# Patient Record
Sex: Female | Born: 1935 | Race: Black or African American | Hispanic: No | Marital: Married
Health system: Southern US, Community
[De-identification: ages and names within clinical notes are randomized; demographics above are authoritative.]

## PROBLEM LIST (undated history)

## (undated) DIAGNOSIS — I10 Essential (primary) hypertension: Secondary | ICD-10-CM

## (undated) DIAGNOSIS — E119 Type 2 diabetes mellitus without complications: Secondary | ICD-10-CM

## (undated) DIAGNOSIS — A419 Sepsis, unspecified organism: Secondary | ICD-10-CM

## (undated) DIAGNOSIS — J9620 Acute and chronic respiratory failure, unspecified whether with hypoxia or hypercapnia: Secondary | ICD-10-CM

## (undated) DIAGNOSIS — I4891 Unspecified atrial fibrillation: Secondary | ICD-10-CM

## (undated) HISTORY — PX: CHOLECYSTECTOMY: SHX55

## (undated) HISTORY — PX: TRACHEOSTOMY: SUR1362

## (undated) HISTORY — PX: PEG TUBE PLACEMENT: SUR1034

---

## 2014-01-28 DIAGNOSIS — A419 Sepsis, unspecified organism: Secondary | ICD-10-CM

## 2014-01-28 HISTORY — DX: Sepsis, unspecified organism: A41.9

## 2014-04-17 ENCOUNTER — Emergency Department (HOSPITAL_COMMUNITY): Payer: Medicare Other

## 2014-04-17 ENCOUNTER — Encounter (HOSPITAL_COMMUNITY): Payer: Self-pay | Admitting: Emergency Medicine

## 2014-04-17 ENCOUNTER — Inpatient Hospital Stay (HOSPITAL_COMMUNITY)
Admission: EM | Admit: 2014-04-17 | Discharge: 2014-04-22 | DRG: 871 | Disposition: A | Discharge status: Other Acute Inpatient Hospital | Payer: Medicare Other | Attending: Internal Medicine | Admitting: Internal Medicine

## 2014-04-17 ENCOUNTER — Inpatient Hospital Stay (HOSPITAL_COMMUNITY): Payer: Medicare Other

## 2014-04-17 DIAGNOSIS — N179 Acute kidney failure, unspecified: Secondary | ICD-10-CM

## 2014-04-17 DIAGNOSIS — J9610 Chronic respiratory failure, unspecified whether with hypoxia or hypercapnia: Secondary | ICD-10-CM | POA: Diagnosis present

## 2014-04-17 DIAGNOSIS — R6520 Severe sepsis without septic shock: Secondary | ICD-10-CM | POA: Diagnosis not present

## 2014-04-17 DIAGNOSIS — Z515 Encounter for palliative care: Secondary | ICD-10-CM | POA: Diagnosis not present

## 2014-04-17 DIAGNOSIS — E875 Hyperkalemia: Secondary | ICD-10-CM

## 2014-04-17 DIAGNOSIS — G9341 Metabolic encephalopathy: Secondary | ICD-10-CM | POA: Diagnosis present

## 2014-04-17 DIAGNOSIS — R6521 Severe sepsis with septic shock: Secondary | ICD-10-CM | POA: Diagnosis not present

## 2014-04-17 DIAGNOSIS — R911 Solitary pulmonary nodule: Secondary | ICD-10-CM | POA: Diagnosis present

## 2014-04-17 DIAGNOSIS — B9620 Unspecified Escherichia coli [E. coli] as the cause of diseases classified elsewhere: Secondary | ICD-10-CM | POA: Diagnosis not present

## 2014-04-17 DIAGNOSIS — I4581 Long QT syndrome: Secondary | ICD-10-CM | POA: Diagnosis not present

## 2014-04-17 DIAGNOSIS — D638 Anemia in other chronic diseases classified elsewhere: Secondary | ICD-10-CM | POA: Diagnosis present

## 2014-04-17 DIAGNOSIS — D696 Thrombocytopenia, unspecified: Secondary | ICD-10-CM | POA: Diagnosis not present

## 2014-04-17 DIAGNOSIS — J9621 Acute and chronic respiratory failure with hypoxia: Secondary | ICD-10-CM | POA: Diagnosis present

## 2014-04-17 DIAGNOSIS — J189 Pneumonia, unspecified organism: Secondary | ICD-10-CM | POA: Diagnosis present

## 2014-04-17 DIAGNOSIS — A419 Sepsis, unspecified organism: Secondary | ICD-10-CM | POA: Diagnosis present

## 2014-04-17 DIAGNOSIS — N390 Urinary tract infection, site not specified: Secondary | ICD-10-CM | POA: Diagnosis present

## 2014-04-17 DIAGNOSIS — E860 Dehydration: Secondary | ICD-10-CM

## 2014-04-17 DIAGNOSIS — L89159 Pressure ulcer of sacral region, unspecified stage: Secondary | ICD-10-CM | POA: Diagnosis not present

## 2014-04-17 DIAGNOSIS — E870 Hyperosmolality and hypernatremia: Secondary | ICD-10-CM | POA: Diagnosis present

## 2014-04-17 DIAGNOSIS — E871 Hypo-osmolality and hyponatremia: Secondary | ICD-10-CM | POA: Diagnosis present

## 2014-04-17 DIAGNOSIS — I129 Hypertensive chronic kidney disease with stage 1 through stage 4 chronic kidney disease, or unspecified chronic kidney disease: Secondary | ICD-10-CM | POA: Diagnosis not present

## 2014-04-17 DIAGNOSIS — Z794 Long term (current) use of insulin: Secondary | ICD-10-CM

## 2014-04-17 DIAGNOSIS — Y95 Nosocomial condition: Secondary | ICD-10-CM | POA: Diagnosis not present

## 2014-04-17 DIAGNOSIS — Z9911 Dependence on respirator [ventilator] status: Secondary | ICD-10-CM | POA: Diagnosis not present

## 2014-04-17 DIAGNOSIS — N281 Cyst of kidney, acquired: Secondary | ICD-10-CM | POA: Diagnosis present

## 2014-04-17 DIAGNOSIS — I480 Paroxysmal atrial fibrillation: Secondary | ICD-10-CM | POA: Diagnosis not present

## 2014-04-17 DIAGNOSIS — G9340 Encephalopathy, unspecified: Secondary | ICD-10-CM

## 2014-04-17 DIAGNOSIS — N189 Chronic kidney disease, unspecified: Secondary | ICD-10-CM | POA: Diagnosis present

## 2014-04-17 DIAGNOSIS — Z931 Gastrostomy status: Secondary | ICD-10-CM | POA: Diagnosis not present

## 2014-04-17 DIAGNOSIS — Z930 Tracheostomy status: Secondary | ICD-10-CM | POA: Diagnosis not present

## 2014-04-17 DIAGNOSIS — D649 Anemia, unspecified: Secondary | ICD-10-CM | POA: Diagnosis not present

## 2014-04-17 DIAGNOSIS — R739 Hyperglycemia, unspecified: Secondary | ICD-10-CM

## 2014-04-17 DIAGNOSIS — E1165 Type 2 diabetes mellitus with hyperglycemia: Secondary | ICD-10-CM | POA: Diagnosis not present

## 2014-04-17 HISTORY — DX: Essential (primary) hypertension: I10

## 2014-04-17 HISTORY — DX: Unspecified atrial fibrillation: I48.91

## 2014-04-17 HISTORY — DX: Acute and chronic respiratory failure, unspecified whether with hypoxia or hypercapnia: J96.20

## 2014-04-17 HISTORY — DX: Type 2 diabetes mellitus without complications: E11.9

## 2014-04-17 HISTORY — DX: Sepsis, unspecified organism: A41.9

## 2014-04-17 LAB — PROTIME-INR
INR: 1.51 — ABNORMAL HIGH (ref 0.00–1.49)
Prothrombin Time: 18.4 seconds — ABNORMAL HIGH (ref 11.6–15.2)

## 2014-04-17 LAB — I-STAT ARTERIAL BLOOD GAS, ED
Acid-base deficit: 1 mmol/L (ref 0.0–2.0)
Acid-base deficit: 4 mmol/L — ABNORMAL HIGH (ref 0.0–2.0)
Bicarbonate: 25.1 mEq/L — ABNORMAL HIGH (ref 20.0–24.0)
Bicarbonate: 21.9 mEq/L (ref 20.0–24.0)
O2 SAT: 78 %
O2 Saturation: 94 %
PCO2 ART: 45.7 mmHg — AB (ref 35.0–45.0)
PH ART: 7.344 — AB (ref 7.350–7.450)
PO2 ART: 70 mmHg — AB (ref 80.0–100.0)
Patient temperature: 97.3
Patient temperature: 94.8
TCO2: 27 mmol/L (ref 0–100)
TCO2: 23 mmol/L (ref 0–100)
pCO2 arterial: 41.3 mmHg (ref 35.0–45.0)
pH, Arterial: 7.322 — ABNORMAL LOW (ref 7.350–7.450)
pO2, Arterial: 44 mmHg — ABNORMAL LOW (ref 80.0–100.0)

## 2014-04-17 LAB — CBC
HEMATOCRIT: 32.4 % — AB (ref 36.0–46.0)
Hemoglobin: 8.4 g/dL — ABNORMAL LOW (ref 12.0–15.0)
MCH: 27.5 pg (ref 26.0–34.0)
MCHC: 25.9 g/dL — AB (ref 30.0–36.0)
MCV: 106.2 fL — AB (ref 78.0–100.0)
Platelets: 44 10*3/uL — ABNORMAL LOW (ref 150–400)
RBC: 3.05 MIL/uL — ABNORMAL LOW (ref 3.87–5.11)
RDW: 19.6 % — AB (ref 11.5–15.5)
WBC: 11.7 10*3/uL — ABNORMAL HIGH (ref 4.0–10.5)

## 2014-04-17 LAB — CBC WITH DIFFERENTIAL/PLATELET
Basophils Absolute: 0.1 10*3/uL (ref 0.0–0.1)
Basophils Relative: 1 % (ref 0–1)
EOS ABS: 0.1 10*3/uL (ref 0.0–0.7)
Eosinophils Relative: 1 % (ref 0–5)
HCT: 34 % — ABNORMAL LOW (ref 36.0–46.0)
HEMOGLOBIN: 8.9 g/dL — AB (ref 12.0–15.0)
LYMPHS ABS: 3.9 10*3/uL (ref 0.7–4.0)
LYMPHS PCT: 28 % (ref 12–46)
MCH: 28 pg (ref 26.0–34.0)
MCHC: 26.2 g/dL — AB (ref 30.0–36.0)
MCV: 106.9 fL — ABNORMAL HIGH (ref 78.0–100.0)
Monocytes Absolute: 0.7 10*3/uL (ref 0.1–1.0)
Monocytes Relative: 5 % (ref 3–12)
NEUTROS ABS: 9 10*3/uL — AB (ref 1.7–7.7)
Neutrophils Relative %: 65 % (ref 43–77)
Platelets: 49 10*3/uL — ABNORMAL LOW (ref 150–400)
RBC: 3.18 MIL/uL — AB (ref 3.87–5.11)
RDW: 19.5 % — AB (ref 11.5–15.5)
WBC: 13.8 10*3/uL — ABNORMAL HIGH (ref 4.0–10.5)

## 2014-04-17 LAB — I-STAT CG4 LACTIC ACID, ED
Lactic Acid, Venous: 3.14 mmol/L (ref 0.5–2.0)
Lactic Acid, Venous: 3.33 mmol/L (ref 0.5–2.0)

## 2014-04-17 LAB — CBG MONITORING, ED
GLUCOSE-CAPILLARY: 547 mg/dL — AB (ref 70–99)
Glucose-Capillary: 547 mg/dL — ABNORMAL HIGH (ref 70–99)
Glucose-Capillary: 575 mg/dL (ref 70–99)
Glucose-Capillary: 520 mg/dL — ABNORMAL HIGH (ref 70–99)
Glucose-Capillary: 470 mg/dL — ABNORMAL HIGH (ref 70–99)

## 2014-04-17 LAB — URINALYSIS, ROUTINE W REFLEX MICROSCOPIC
BILIRUBIN URINE: NEGATIVE
GLUCOSE, UA: 100 mg/dL — AB
Ketones, ur: NEGATIVE mg/dL
Nitrite: NEGATIVE
Protein, ur: 30 mg/dL — AB
Specific Gravity, Urine: 1.019 (ref 1.005–1.030)
Urobilinogen, UA: 1 mg/dL (ref 0.0–1.0)
pH: 5 (ref 5.0–8.0)

## 2014-04-17 LAB — COMPREHENSIVE METABOLIC PANEL
ALBUMIN: 2 g/dL — AB (ref 3.5–5.2)
ALT: 16 U/L (ref 0–35)
ALT: 17 U/L (ref 0–35)
AST: 29 U/L (ref 0–37)
AST: 27 U/L (ref 0–37)
Albumin: 1.9 g/dL — ABNORMAL LOW (ref 3.5–5.2)
Alkaline Phosphatase: 94 U/L (ref 39–117)
Alkaline Phosphatase: 78 U/L (ref 39–117)
BUN: 255 mg/dL — ABNORMAL HIGH (ref 6–23)
BUN: 245 mg/dL — ABNORMAL HIGH (ref 6–23)
CO2: 22 mmol/L (ref 19–32)
CO2: 23 mmol/L (ref 19–32)
Calcium: 9.9 mg/dL (ref 8.4–10.5)
Calcium: 9.3 mg/dL (ref 8.4–10.5)
Chloride: >130 mmol/L (ref 96–112)
Creatinine, Ser: 3.22 mg/dL — ABNORMAL HIGH (ref 0.50–1.10)
Creatinine, Ser: 3.03 mg/dL — ABNORMAL HIGH (ref 0.50–1.10)
GFR calc Af Amer: 15 mL/min — ABNORMAL LOW (ref >90)
GFR calc non Af Amer: 13 mL/min — ABNORMAL LOW (ref >90)
GFR calc non Af Amer: 14 mL/min — ABNORMAL LOW (ref >90)
GFR, EST AFRICAN AMERICAN: 16 mL/min — AB (ref >90)
GLUCOSE: 656 mg/dL — AB (ref 70–99)
Glucose, Bld: 712 mg/dL (ref 70–99)
POTASSIUM: 5.9 mmol/L — AB (ref 3.5–5.1)
POTASSIUM: 4.5 mmol/L (ref 3.5–5.1)
SODIUM: 174 mmol/L — AB (ref 135–145)
Sodium: 174 mmol/L (ref 135–145)
TOTAL PROTEIN: 7.8 g/dL (ref 6.0–8.3)
Total Bilirubin: 0.6 mg/dL (ref 0.3–1.2)
Total Bilirubin: 0.3 mg/dL (ref 0.3–1.2)
Total Protein: 7.1 g/dL (ref 6.0–8.3)

## 2014-04-17 LAB — PROCALCITONIN: Procalcitonin: 5.93 ng/mL

## 2014-04-17 LAB — SAVE SMEAR

## 2014-04-17 LAB — RETICULOCYTES
RBC.: 2.03 MIL/uL — ABNORMAL LOW (ref 3.87–5.11)
RETIC CT PCT: 3.8 % — AB (ref 0.4–3.1)
Retic Count, Absolute: 77.1 10*3/uL (ref 19.0–186.0)

## 2014-04-17 LAB — URINE MICROSCOPIC-ADD ON

## 2014-04-17 LAB — AMMONIA: Ammonia: 20 umol/L (ref 11–32)

## 2014-04-17 LAB — APTT: aPTT: 38 seconds — ABNORMAL HIGH (ref 24–37)

## 2014-04-17 LAB — LACTIC ACID, PLASMA: Lactic Acid, Venous: 3.6 mmol/L (ref 0.5–2.0)

## 2014-04-17 MED ORDER — SODIUM CHLORIDE 0.9 % IV BOLUS (SEPSIS)
1000.0000 mL | Freq: Once | INTRAVENOUS | Status: AC
  Administered 2014-04-17: 1000 mL via INTRAVENOUS

## 2014-04-17 MED ORDER — SODIUM CHLORIDE 0.9 % IV BOLUS (SEPSIS)
1000.0000 mL | Freq: Once | INTRAVENOUS | Status: AC
Start: 2014-04-18 — End: 2014-04-18
  Administered 2014-04-17: 1000 mL via INTRAVENOUS

## 2014-04-17 MED ORDER — SODIUM CHLORIDE 0.9 % IV BOLUS (SEPSIS): 1000.0000 mL | INTRAVENOUS | Status: DC

## 2014-04-17 MED ORDER — DEXTROSE 50 % IV SOLN
25.0000 mL | INTRAVENOUS | Status: DC | PRN
  Administered 2014-04-18 – 2014-04-19 (×2): 25 mL via INTRAVENOUS
  Filled 2014-04-17 (×2): qty 50

## 2014-04-17 MED ORDER — SODIUM CHLORIDE 0.45 % IV SOLN
INTRAVENOUS | Status: DC
  Administered 2014-04-17: 20:00:00 via INTRAVENOUS

## 2014-04-17 MED ORDER — DEXTROSE-NACL 5-0.45 % IV SOLN: INTRAVENOUS | Status: DC

## 2014-04-17 MED ORDER — DEXTROSE 5 % IV SOLN
1.0000 g | Freq: Once | INTRAVENOUS | Status: AC
  Administered 2014-04-17: 1 g via INTRAVENOUS
  Filled 2014-04-17: qty 10

## 2014-04-17 MED ORDER — SODIUM CHLORIDE 0.9 % IV SOLN: INTRAVENOUS | Status: DC

## 2014-04-17 MED ORDER — SODIUM CHLORIDE 0.9 % IV SOLN
INTRAVENOUS | Status: DC
  Administered 2014-04-17 – 2014-04-18 (×2): via INTRAVENOUS

## 2014-04-17 MED ORDER — PIPERACILLIN-TAZOBACTAM 3.375 G IVPB
3.3750 g | Freq: Three times a day (TID) | INTRAVENOUS | Status: DC
  Administered 2014-04-18: 3.375 g via INTRAVENOUS
  Filled 2014-04-17 (×3): qty 50

## 2014-04-17 MED ORDER — INSULIN REGULAR BOLUS VIA INFUSION
0.0000 [IU] | Freq: Three times a day (TID) | INTRAVENOUS | Status: DC
  Filled 2014-04-17: qty 10

## 2014-04-17 MED ORDER — SODIUM CHLORIDE 0.9 % IV SOLN
INTRAVENOUS | Status: DC
  Administered 2014-04-17: 5.2 [IU]/h via INTRAVENOUS
  Filled 2014-04-17: qty 2.5

## 2014-04-17 MED ORDER — SODIUM CHLORIDE 0.9 % IV BOLUS (SEPSIS)
500.0000 mL | INTRAVENOUS | Status: DC
Start: 2014-04-17 — End: 2014-04-17

## 2014-04-17 MED ORDER — PIPERACILLIN-TAZOBACTAM 3.375 G IVPB 30 MIN
3.3750 g | Freq: Once | INTRAVENOUS | Status: AC
  Administered 2014-04-17: 3.375 g via INTRAVENOUS
  Filled 2014-04-17: qty 50

## 2014-04-17 MED ORDER — VANCOMYCIN HCL IN DEXTROSE 750-5 MG/150ML-% IV SOLN: 750.0000 mg | INTRAVENOUS | Status: DC

## 2014-04-17 MED ORDER — DEXTROSE 5 % IV SOLN: Freq: Once | INTRAVENOUS | Status: DC

## 2014-04-17 MED ORDER — DEXTROSE 5 % IV SOLN: 2.0000 g | Freq: Once | INTRAVENOUS | Status: DC

## 2014-04-17 MED ORDER — VANCOMYCIN HCL IN DEXTROSE 1-5 GM/200ML-% IV SOLN
1000.0000 mg | Freq: Once | INTRAVENOUS | Status: AC
  Administered 2014-04-17: 1000 mg via INTRAVENOUS
  Filled 2014-04-17: qty 200

## 2014-04-17 MED ORDER — HEPARIN SODIUM (PORCINE) 5000 UNIT/ML IJ SOLN: 5000.0000 [IU] | Freq: Three times a day (TID) | INTRAMUSCULAR | Status: DC

## 2014-04-17 MED ORDER — SODIUM POLYSTYRENE SULFONATE 15 GM/60ML PO SUSP
60.0000 g | Freq: Once | ORAL | Status: AC
  Administered 2014-04-17: 60 g via RECTAL
  Filled 2014-04-17: qty 240

## 2014-04-17 MED ORDER — SODIUM CHLORIDE 0.9 % IJ SOLN: 3.0000 mL | Freq: Two times a day (BID) | INTRAMUSCULAR | Status: DC

## 2014-04-17 MED ORDER — SODIUM CHLORIDE 0.45 % IV SOLN
INTRAVENOUS | Status: DC
  Administered 2014-04-18 – 2014-04-20 (×3): via INTRAVENOUS

## 2014-04-17 MED ORDER — SODIUM CHLORIDE 0.9 % IV BOLUS (SEPSIS): 500.0000 mL | Freq: Once | INTRAVENOUS | Status: DC

## 2014-04-17 NOTE — ED Notes (Signed)
cbg is 547.

## 2014-04-17 NOTE — ED Notes (Signed)
Spoke to intensivist, paul, np in regards to fluids reeceived. Currently, ns started by hospitalist, he acnkowledges, initially paused, but restarted ns at 125 per verbal order.

## 2014-04-17 NOTE — Progress Notes (Signed)
**Note De-Identified Mccabe Gloria Obfuscation** ABG collected; MD notified of mixed sample.  No recollect.

## 2014-04-17 NOTE — ED Notes (Signed)
Patient travelling to ct with rn

## 2014-04-17 NOTE — ED Notes (Signed)
Phlebotomy at the bedside. Intensivist to see the patient.

## 2014-04-17 NOTE — ED Provider Notes (Signed)
CSN: [id number]     Arrival date & time 04/17/14  1729 History   First MD Initiated Contact with Patient 04/17/14 1730     Chief Complaint  Patient presents with  . Altered Mental Status  . Abnormal Lab   Level V caveat: The patient is nonverbal. HPI The patient presents from [facility]. The patient has a tracheostomy. She also has a central line in the left internal jugular. The [facility] because she's had decreased responsiveness, elevated potassium and hypotension. The onset of the symptoms are unclear but apparently the patient had laboratory testing performed today. The results showed that her creatinine was elevated at 2.8. Her glucose was 447. Her potassium is 6.5. Her sodium is elevated at 185 and her chloride is 140. Patient is unable to communicate with me and provide any history. Past Medical History  Diagnosis Date  . Diabetes mellitus without complication   . Hypertension   . A-fib   . Respiratory failure, acute and chronic   . Sepsis 01/28/14    Perforated choly operated on at [facility]; septic post op.   Past Surgical History  Procedure Laterality Date  . Cholecystectomy    . Tracheostomy    . Peg tube placement     History reviewed. No pertinent family history. History  Substance Use Topics  . Smoking status: Unknown If Ever Smoked  . Smokeless tobacco: Not on file  . Alcohol Use: Not on file     Comment: unable to assess   OB History    No data available     Review of Systems  All other systems reviewed and are negative.     Allergies  Review of patient's allergies indicates no known allergies.  Home Medications   Prior to Admission medications   Medication Sig Start Date End Date Taking? Authorizing Provider  diltiazem (TIAZAC) 360 MG 24 hr capsule Take 360 mg by mouth daily. 01/20/14   Historical Provider, MD  valsartan-hydrochlorothiazide (DIOVAN-HCT) 320-25 MG per tablet   01/20/14   Historical Provider, MD   BP 93/55 mmHg  Pulse 86  Temp(Src) 97.3 F (36.3 C) (Rectal)  Resp 33  SpO2 99% Physical Exam  Constitutional: No distress.  Chronically ill-appearing  HENT:  Head: Normocephalic and atraumatic.  Right Ear: External ear normal.  Left Ear: External ear normal.  Mouth/Throat: No oropharyngeal exudate (mucous membranes are dry).  Eyes: Conjunctivae are normal. Right eye exhibits no discharge. Left eye exhibits no discharge. No scleral icterus.  Neck: Neck supple. No tracheal deviation present.  Tracheostomy in place  Cardiovascular: Normal rate, regular rhythm and intact distal pulses.   Weak pulses in all extremities  Pulmonary/Chest: Effort normal and breath sounds normal. No stridor. No respiratory distress. She has no wheezes. She has no rales.  Abdominal: Soft. Bowel sounds are normal. She exhibits no distension. There is no tenderness. There is no rebound and no guarding.  Feeding tube without drainage, approximately 6 cm curvilinear open wound in the right upper quadrant, the wound is packed there is pink granulation tissue without any purulent drainage  Musculoskeletal: She exhibits edema. She exhibits no tenderness.  Neurological: She is unresponsive. She displays tremor. A sensory deficit is present. No cranial nerve deficit (no facial droop, extraocular movements intact, no slurred speech). She exhibits normal muscle tone. She displays no seizure activity. GCS eye subscore is 4. GCS verbal subscore is 1. GCS motor subscore is 5.  Patient does not follow any commands, I do see some movements of both hands as well as her right lower extremity, I have not seen any movement of her left foot  Skin: Skin is warm and dry. No rash noted.  Psychiatric: She has a normal mood and affect.  Nursing note and vitals reviewed.   ED Course  Procedures (including critical care time) CRITICAL CARE Performed by: ZOXWR,UEA Total critical care time:  40 Critical care time was exclusive of separately billable procedures and treating other patients. Critical care was necessary to treat or prevent imminent or life-threatening deterioration. Critical care was time spent personally by me on the following activities: development of treatment plan with patient and/or surrogate as well as nursing, discussions with consultants, evaluation of patient's response to treatment, examination of patient, obtaining history from patient or surrogate, ordering and performing treatments and interventions, ordering and review of laboratory studies, ordering and review of radiographic studies, pulse oximetry and re-evaluation of patient's condition.  Labs Review Labs Reviewed  CBC WITH DIFFERENTIAL/PLATELET - Abnormal; Notable for the following:    WBC 13.8 (*)    RBC 3.18 (*)    Hemoglobin 8.9 (*)    HCT 34.0 (*)    MCV 106.9 (*)    MCHC 26.2 (*)    RDW 19.5 (*)    Platelets 49 (*)    Neutro Abs 9.0 (*)    All other components within normal limits  COMPREHENSIVE METABOLIC PANEL - Abnormal; Notable for the following:    Sodium 174 (*)    Potassium 5.9 (*)    Chloride >130 (*)    Glucose, Bld 712 (*)    BUN 255 (*)    Creatinine, Ser 3.22 (*)    Albumin 2.0 (*)    GFR calc non Af Amer 13 (*)    GFR calc Af Amer 15 (*)    All other components within normal limits  URINALYSIS, ROUTINE W REFLEX MICROSCOPIC - Abnormal; Notable for the following:    APPearance TURBID (*)    Glucose, UA 100 (*)    Hgb urine dipstick LARGE (*)    Protein, ur 30 (*)    Leukocytes, UA LARGE (*)    All other components within normal limits  URINE MICROSCOPIC-ADD ON - Abnormal; Notable for the following:    Bacteria, UA MANY (*)    All other components within normal limits  I-STAT CG4 LACTIC ACID, ED - Abnormal; Notable for the following:    Lactic Acid, Venous 3.14 (*)    All other components within normal limits  I-STAT ARTERIAL BLOOD GAS, ED - Abnormal; Notable for  the following:    pH, Arterial 7.344 (*)    pCO2 arterial 45.7 (*)    pO2, Arterial 44.0 (*)    Bicarbonate 25.1 (*)    All other components within normal limits  CBG MONITORING, ED - Abnormal; Notable for the following:    Glucose-Capillary 547 (*)    All other components within normal limits  CULTURE, BLOOD (ROUTINE X 2)  CULTURE, BLOOD (ROUTINE X 2)  URINE CULTURE    Imaging Review Dg Chest Port 1 View  04/17/2014   CLINICAL DATA:  Hypotension for 1 day. Altered mental status. Abnormal laboratory results.  EXAM: PORTABLE CHEST - 1 VIEW  COMPARISON:  None.  FINDINGS: Support apparatus: Tracheostomy is present with the tip at the level of the clavicles. LEFT IJ central line is present with the tip in the mid superior vena cava. Monitoring  leads project over the chest.  Cardiomediastinal Silhouette:  Within normal limits.  Lungs: Retrocardiac density likely represents atelectasis. No focal consolidation. No pneumothorax.  Effusions:  None.  Other:  None.  IMPRESSION: 1. Support apparatus appears in good position. 2. No acute cardiopulmonary disease.   Electronically Signed   By: Andreas NewportGeoffrey  Lamke M.D.   On: 04/17/2014 18:07     EKG Interpretation   Date/Time:  Thursday April 17 2014 17:51:50 EDT Ventricular Rate:  88 PR Interval:  165 QRS Duration: 91 QT Interval:  421 QTC Calculation: 509 R Axis:   44 Text Interpretation:  Sinus rhythm Borderline low voltage, extremity leads  Abnormal R-wave progression, early transition Prolonged QT interval No old  tracing to compare Confirmed by Elloise Roark  MD-J, Yomira Flitton [phone]([id number]) on 04/17/2014  6:57:37 PM      MDM   Final diagnoses:  Hypernatremia  Hyperglycemia  Dehydration  UTI (lower urinary tract infection)    I reviewed the notes from [facility]. Patient was admitted there after having had septic shock, ventilator dependent respiratory failure, and a postsurgical abdominal wound. The patient had been [facility] for septic  shock and perforated cholecystitis. Surgery on 01/29/2014. Patient also experienced Escherichia coli sepsis she also had A. fib with RVR that was treated with amiodarone. Patient also experienced C. difficile colitis.  She is not currently on a ventilator.  Labs show severe hyernatremia and hyperglycemia.  She needs free water but her blood sugar is significantly elevated.  Will give her 1/2 normal saline, start an insulin drip and convert to d5w once blood sugar is better controlled.  Reviewed notes from [facility].  Pt is currently a full code.  Anemia is stable.  Hgb was 8.4 on 2/23 at [facility]   Linwood DibblesJon Mikita Lesmeister, MD 04/17/14 [id number]

## 2014-04-17 NOTE — ED Notes (Signed)
712 glucose, na 174, cl  Greater than 130. Called in from main lab.

## 2014-04-17 NOTE — ED Notes (Signed)
Called pharmacy in regards to insulin drip, reviewed lab results with Dr. Lynelle DoctorKnapp.

## 2014-04-17 NOTE — ED Notes (Signed)
[facility] with c/o abnormal labs and hypotension.  Per EMS pt is nonverbal and unresponsive to verbal stimuli - Per [facility] pt is normally responsive and able to speak.    Labs per [facility]:  Glucose 447 BUN 245 Creatinine 2.8 Sodium 185 Potassium 6.5 Chloride 140 Enzymatic CO2 27 Calcium 10.0  Vitals - 110/60 per EMS ([facility] Reported to EMS 80/40BP) SpO2 97% Trach with humidified O2 3L, 85bpm and regular. Report given to [facility] RN

## 2014-04-17 NOTE — Progress Notes (Signed)
eLink Physician-Brief Progress Note Patient Name: Sierra Kerr DOB: 03/15/1934 MRN: [id number]   Date of Service  04/17/2014  HPI/Events of Note  Lactic Acid = 3.6  eICU Interventions  Bolus with 0.9 NaCl 1 liter IV over 1 hour now.      Intervention Category Major Interventions: Acid-Base disturbance - evaluation and management Minor Interventions: Communication with other healthcare providers and/or family  Sierra Kerr,[name] 04/17/2014, 11:49 PM

## 2014-04-17 NOTE — Progress Notes (Signed)
ANTIBIOTIC CONSULT NOTE - INITIAL  Pharmacy Consult for Vancomycin and Zosyn Indication: rule out sepsis  No Known Allergies  Patient Measurements: Height: 5\' 6"  (167.6 cm) Weight: 120 lb (54.432 kg) IBW/kg (Calculated) : 59.3 Adjusted Body Weight:   Vital Signs: Temp: 94.8 F (34.9 C) (03/31 2228) Temp Source: Rectal (03/31 2228) BP: 101/62 mmHg (03/31 2300) Pulse Rate: 135 (03/31 2300) Intake/Output from previous day:   Intake/Output from this shift: Total I/O In: 1050 [I.V.:1050] Out: 275 [Urine:275]  Labs:  Recent Labs  04/17/14 1808 04/17/14 2151  WBC 13.8* 11.7*  HGB 8.9* 8.4*  PLT 49* 44*  CREATININE 3.22*  --    Estimated Creatinine Clearance: 12.6 mL/min (by C-G formula based on Cr of 3.22). No results for input(s): VANCOTROUGH, VANCOPEAK, VANCORANDOM, GENTTROUGH, GENTPEAK, GENTRANDOM, TOBRATROUGH, TOBRAPEAK, TOBRARND, AMIKACINPEAK, AMIKACINTROU, AMIKACIN in the last 72 hours.   Microbiology: No results found for this or any previous visit (from the past 720 hour(s)).  Medical History: Past Medical History  Diagnosis Date  . Diabetes mellitus without complication   . Hypertension   . A-fib   . Respiratory failure, acute and chronic   . Sepsis 01/28/14    [facility]; septic post op.    Medications:   (Not in a hospital admission) Scheduled:  . sodium chloride  3 mL Intravenous Q12H   Infusions:  . sodium chloride    . sodium chloride Stopped (04/17/14 2228)  . dextrose 5 % and 0.45% NaCl    . insulin (NOVOLIN-R) infusion    . [START ON 04/18/2014] insulin regular    . piperacillin-tazobactam     Assessment: 80yo female with history of DM and PAF presents with sepsis possibly 2/2 UTI. Pharmacy is consulted to dose vancomycin and zosyn for rule out sepsis. Pt is hypothermic to 94.8, WBC 11.7, sCr 3.22, LA 3.33.  Goal of Therapy:  Vancomycin trough level 15-20 mcg/ml  Plan:  Vancomycin 1g IV once followed  by 750mg  q48h Zosyn 3.375g IV q8h Measure antibiotic drug levels at steady state Follow up culture results, renal function, and clinical course  Sierra HoppingCorey M. Kerr PiesBall, PharmD Clinical Pharmacist Pager [phone] 04/17/2014,11:03 PM

## 2014-04-17 NOTE — ED Notes (Signed)
Cg-4 reported to Dr. Lynelle DoctorKnapp

## 2014-04-17 NOTE — ED Notes (Signed)
Dressing to abdomen has an estimated 5 inch wound with sanguinous drainage. Dressing dated 04/17/14. Removed for assessment and redressed.

## 2014-04-17 NOTE — ED Notes (Signed)
cbg of 520

## 2014-04-17 NOTE — ED Notes (Signed)
Respiratory called to assist with transport to CT.

## 2014-04-17 NOTE — Consult Note (Signed)
PULMONARY / CRITICAL CARE MEDICINE   Name: Sierra Kerr MRN: [id number] DOB: 09/26/1934    ADMISSION DATE:  04/17/2014 CONSULTATION DATE:  04/17/2014  REFERRING MD :  Lynelle DoctorKNAPP EDP  CHIEF COMPLAINT:  AMS  INITIAL PRESENTATION: 80 year old female with trach/chronic vent [facility] from [facility] with complaints of AMS, hyperkalemia, and hypernatremia.   STUDIES:  3/31 CT head> Atrophy without acute intracranial abnormality. 3/31 CT chest/a/p > b/l lower lobe airspace consolidation, spiculated 6mm nodule R lung apex. Edema suggesting UTI, Hypodense lesion R kidney.  SIGNIFICANT EVENTS: 1/13 . [facility] cholecyst[facility] with perforation, septic shock, intubated. To OR 2/5 Trach/PEG 2/23 > to [facility] 3/31 > AMS, to ED  HISTORY OF PRESENT ILLNESS:  80 year old female with PMH DM, and PAF. [facility]  For perforated cholecystitis with surgical repair 01/29/2014. She was an mechanical ventilator for the entirety of her stay, which was complicated by AF RVR, C-diff, and refractory respiratory failure which ultimately resulted in trach/PEG 2/5. Also had upper extremity DVT from line, however unable to tolerate anticoagulation due to hemoptysis. [facility] Since her admission there she has shown minimal functional improvement, however if sounds as though she has progressed to tolerate some capping trials with trach. Reportedly was recannulated 3/31 due to following, She had decreased level of responsiveness (baseline appears to be some movement of lower extremities and significant non-purposeful movement of upper arms, and grimacing to pain). Lab work was sent and noted profound hypernatremia, hyperkalemia, and hyperglycemia.  She was sent to ED for eval. PCCM to see.   PAST MEDICAL HISTORY :   has a past medical history of Diabetes mellitus without complication; Hypertension; A-fib; Respiratory failure, acute and chronic; and Sepsis  (01/28/14).  has past surgical history that includes Cholecystectomy; Tracheostomy; and PEG tube placement. Prior to Admission medications   Medication Sig Start Date End Date Taking? Authorizing Provider  diltiazem (TIAZAC) 360 MG 24 hr capsule Take 360 mg by mouth daily. 01/20/14   Historical Provider, MD  valsartan-hydrochlorothiazide (DIOVAN-HCT) 320-25 MG per tablet  01/20/14   Historical Provider, MD   No Known Allergies  FAMILY HISTORY:  has no family status information on file.  SOCIAL HISTORY:    REVIEW OF SYSTEMS: unable   SUBJECTIVE:   VITAL SIGNS: Temp:  [97.3 F (36.3 C)] 97.3 F (36.3 C) (03/31 1746) Pulse Rate:  [78-88] 78 (03/31 2145) Resp:  [14-33] 19 (03/31 2145) BP: (93-119)/(36-94) 119/94 mmHg (03/31 2145) SpO2:  [95 %-100 %] 100 % (03/31 2145) FiO2 (%):  [28 %-35 %] 35 % (03/31 1853) Weight:  [54.432 kg (120 lb)] 54.432 kg (120 lb) (03/31 1957) HEMODYNAMICS:   VENTILATOR SETTINGS: Vent Mode:  [-]  FiO2 (%):  [28 %-35 %] 35 % INTAKE / OUTPUT:  Intake/Output Summary (Last 24 hours) at 04/17/14 2150 Last data filed at 04/17/14 2011  Gross per 24 hour  Intake   1000 ml  Output    275 ml  Net    725 ml    PHYSICAL EXAMINATION: General:  Female chronically ill appearing in NAD Neuro:  Alert, grimace to pain HEENT:  [place]/AT, no JVD, trach in place, PERRL Cardiovascular:  Tachy 115, irreg irreg Lungs:  Diminished BL bases Abdomen:  Soft, non-distended, remote abd surgical wound. Musculoskeletal:  No acute deformity Skin:  Grossly intact  LABS:  CBC  Recent Labs Lab 04/17/14 1808  WBC 13.8*  HGB  8.9*  HCT 34.0*  PLT 49*   Coag's No results for input(s): APTT, INR in the last 168 hours. BMET  Recent Labs Lab 04/17/14 1808  NA 174*  K 5.9*  CL >130*  CO2 22  BUN 255*  CREATININE 3.22*  GLUCOSE 712*   Electrolytes  Recent Labs Lab 04/17/14 1808  CALCIUM 9.9   Sepsis Markers  Recent Labs Lab 04/17/14 1832  LATICACIDVEN  3.14*   ABG  Recent Labs Lab 04/17/14 1834  PHART 7.344*  PCO2ART 45.7*  PO2ART 44.0*   Liver Enzymes  Recent Labs Lab 04/17/14 1808  AST 29  ALT 16  ALKPHOS 94  BILITOT 0.6  ALBUMIN 2.0*   Cardiac Enzymes No results for input(s): TROPONINI, PROBNP in the last 168 hours. Glucose  Recent Labs Lab 04/17/14 1815 04/17/14 1944 04/17/14 2041  GLUCAP 547* 575* 520*    Imaging No results found.   ASSESSMENT / PLAN:  PULMONARY Trach 2/5  [facility] > A: A on C hypoxemic respiratory failure Lung nodule RLL PNA Trach status  P:   Continue ATC 35% titrate to SpO2 > 92% May need cuffed trach placed and vent support Check ABG Follow CXR   CARDIOVASCULAR CVL [facility] pta > A:  Hypotension - suspect hypovolemia/sepsis Atrial fib - RVR QTc prolongation Elevated lactic acid  P:  Telemetry monitoring Hold outpatient antihypertensives Volume per renal section Trend lactic Amio if needed for rhythm control EKG in AM  RENAL A:   AKI R Renal lesion Hypernatremia (corrects to 187) - Hospitalist and nephrology conversation agree likely due to dehydration.  Hyperkalemia  P:   Q 4 hour Bmet Kayexalate NS 125/hr for now Target Na drop 0.5-1/hr. Renal US Send serum/urine osm Urine Na   GASTROINTESTINAL A:   H/o cholecystis with perforation PEG  P:   Hold TF for now SUP: IV protonix  HEMATOLOGIC A:   Anemia Thrombocytopenia  P:  Monitor CBC Transfuse per ICU guidlines  INFECTIOUS A:   Severe Sepsis, suspect UTI source although abd wound, PNA, CVL also considered.   P:   BCx2 3/31 > UC 3/31 > Sputum 3/31 > Abx: Zosyn, start date 3/31> Abx: Vancomycin, start date 3/31> PCT Follow WBC and fever curve  ENDOCRINE A:   Hyperglycemia with history of DM HHNK    P:   Insulin gtt per protocol CBG monitoring  NEUROLOGIC A:   Acute on chronic encephalopathy  P:   RASS goal: 0 Monitor   FAMILY  - Updates:   - Inter-disciplinary  family meet or Palliative Care meeting due by:  4/6    Joneen Roach, AGACNP-BC [place] [facility] Pager [phone] or [phone]  04/17/2014 10:48 PM

## 2014-04-17 NOTE — ED Notes (Signed)
Patient returns from CT with RN.

## 2014-04-17 NOTE — ED Notes (Signed)
Spoke to intensivist, reported afib with heart rate in 130's and cool rectal temp, so bair hugger applied. NP acknowledges, change in bed type. Informed bed control

## 2014-04-17 NOTE — ED Notes (Signed)
cbg is 575

## 2014-04-17 NOTE — ED Notes (Signed)
Cg-4 of 3.33 reported to Dr. Lynelle DoctorKnapp

## 2014-04-17 NOTE — ED Notes (Signed)
CBG 547 

## 2014-04-17 NOTE — ED Notes (Signed)
np paged by Diplomatic Services operational officersecretary

## 2014-04-17 NOTE — ED Notes (Signed)
RT at bedside to assess trach

## 2014-04-17 NOTE — H&P (Signed)
Hospitalist Admission History and Physical  Patient name: Sierra Kerr Medical record number: [id number] Date of birth: May 11, 1934 Age: 80 y.o. Gender: female  Primary Care Provider: Hillary Bow, MD  Chief Complaint: sepsis, encephalopathy, hypernatremia, hyperkalemia, AKI, dehydration, UTI   History of Present Illness:This is a 80 y.o. year old female with significant past medical history of IDDM, chronic resp failure s/p trach, atrial fibrillation, UE DVT, septic shock s/p perforated cholecystectomy presenting with sepsis, encephalopathy, hypernatremia, hyperkalemia, AKI, dehydration, UTI, hyperglycemia. LEVEL V CAVEAT AS PT IS NON VERBAL-ONLY RESPONSIVE TO PAINFUL STIMULI. Patient is noted above for long-term care facility patient noted to have March 21 for septic shock, ventilator dependent 20 failure, sacral, post surgical abdominal pain who was transferred from the hospital for septic shock secondary to perforated cholecystitis. Noted surgery on 01/29/1998 also with Escherichia coli sepsis secondary atrial fibrillation with RVR. Was chemically cardioverted with amiodarone-now on diltiazem. Currently PEG tube in place. Noted history of upper extremity DVT. However was unable to tolerate oral anticoagulation  2/2 hemoptysis. Was able to be extubated, however did have 1 episode of PNA. Please see [facility] records for full details.  Per report, patient with progressively worsening electrolytes and renal function. Patient with a noted baseline creatinine of 0.5 no reported fevers or chills. Presented to ER T 97.3, HR 80s, resp 20s-30s, BP 90s-100s, satting 98% on 35% FiO2-3L, WBC 13.8, hrb 8.9, plt 49, Cr 3.22, Na 174 (Corrected Na 187), K 5.9, BUN 255, Glu 712, Lactate 3.12. UA indicative of infection. CXR negative for PNA.-trach stable.  EKG sinus rhythm w/ no peaked T waves.  Per report, pt is on palliative care at [facility], however is Full Code   Assessment and Plan: Sierra Kerr is a 80  y.o. year old female presenting with sepsis, encephalopathy, hypernatremia, hyperkalemia, AKI, dehydration, UTI, hyperglycemia  Active Problems:   Hypernatremia   Sepsis   Encephalopathy   Hyperkalemia   Hyperglycemia   AKI (acute kidney injury)   UTI (lower urinary tract infection)   1-Sepsis  -Meets Criteria by RR, BP, WBC  -multiple potential etiologies, though urinary source seems most predominant at present -noted post surgical abd wound-appears stable  -sacral decub check -pending CT chest, abd, pelvis -resp status stable- satting well on trach w/ 35% FiO2 @ 3L  -IV rocephin  -pan culture -noted elevated lactate @ 3.12 in setting of dehydration  -hydrate -trend  -pending formal PCCM consult   2- Encephalopathy -multifactorial in setting of above w/ dehydration, hypernatremia, hyperglycemia   -unclear of baseline -ammonia  -head CT -treat as above -hydrate -follow  3-Hypernatremia -suspect likely secondary to dehydration  -markedly dry on exam -corrected Na @ 187 given hyperglycemia -free H2O deficit of 11L  -start on 1/2 NS -serial electrolytes -discussed case w/ Dr. Lowell Guitar who will formally consult in am  -recommends aggressive hydration  -start 1/2 NS @ 250 ccs/hr -no known hx/o cardiomyopathy -pending CT abd r/o obstruction (free H2O flushes w/ PEG may be an option) -follow intake and output   4-Hyperkalemia -no peaked T waves on imaging  -kayexalate x1  5-AKI -likely prerenal in etiology -markedly dry on am  -BUN>>20:1 -pending CT abd and pel  -FeNa -formal renal consult in am  -hydrate -avoid nephrotoxic agents   6-Hyperglycemia -glucomander -A1C  7-Chronic resp failure -trach stable  -no resp distress/hypoxia -cont supp O2  8-Afib  -NSR on EKG -dilt gtt prn   9-thrombocytopenia -likely secondary to sepsis -peripheral smear  -hold anticoagulation-SCDs  10-Anemia -  unclear of baseline -no overt signs of bleeding -trend   -anemia panel  -follow   FEN/GI: NPO. PEG tube once cleared  Prophylaxis: SCDs  Disposition: pending further evaluation  Code Status:Full Code    Patient Active Problem List   Diagnosis Date Noted  . Hypernatremia 04/17/2014   Past Medical History: Past Medical History  Diagnosis Date  . Diabetes mellitus without complication   . Hypertension   . A-fib   . Respiratory failure, acute and chronic   . Sepsis 01/28/14    Perforated choly operated on at [facility]; septic post op.    Past Surgical History: Past Surgical History  Procedure Laterality Date  . Cholecystectomy    . Tracheostomy    . Peg tube placement      Social History: History   Social History  . Marital Status: Married    Spouse Name: N/A  . Number of Children: N/A  . Years of Education: N/A   Social History Main Topics  . Smoking status: Unknown If Ever Smoked  . Smokeless tobacco: Not on file  . Alcohol Use: Not on file     Comment: unable to assess  . Drug Use: Not on file  . Sexual Activity: Not on file   Other Topics Concern  . None   Social History Narrative  . None    Family History: History reviewed. No pertinent family history.  Allergies: No Known Allergies  Current Facility-Administered Medications  Medication Dose Route Frequency Provider Last Rate Last Dose  . 0.45 % sodium chloride infusion   Intravenous Continuous Linwood Dibbles, MD 125 mL/hr at 04/17/14 2002    . 0.45 % sodium chloride infusion   Intravenous Continuous Floydene Flock, MD      . 0.9 %  sodium chloride infusion   Intravenous Continuous Floydene Flock, MD      . cefTRIAXone (ROCEPHIN) 1 g in dextrose 5 % 50 mL IVPB  1 g Intravenous Once Linwood Dibbles, MD 100 mL/hr at 04/17/14 2010 1 g at 04/17/14 2010  . dextrose 5 %-0.45 % sodium chloride infusion   Intravenous Continuous Floydene Flock, MD      . dextrose 50 % solution 25 mL  25 mL Intravenous PRN Floydene Flock, MD      . heparin injection 5,000 Units   5,000 Units Subcutaneous 3 times per day Floydene Flock, MD      . insulin regular (NOVOLIN R,HUMULIN R) 250 Units in sodium chloride 0.9 % 250 mL (1 Units/mL) infusion   Intravenous Continuous Linwood Dibbles, MD 5.2 mL/hr at 04/17/14 2002 5.2 Units/hr at 04/17/14 2002  . insulin regular (NOVOLIN R,HUMULIN R) 250 Units in sodium chloride 0.9 % 250 mL (1 Units/mL) infusion   Intravenous Continuous Floydene Flock, MD      . Melene Muller ON 04/18/2014] insulin regular bolus via infusion 0-10 Units  0-10 Units Intravenous TID WC Floydene Flock, MD      . sodium chloride 0.9 % injection 3 mL  3 mL Intravenous Q12H Floydene Flock, MD       Current Outpatient Prescriptions  Medication Sig Dispense Refill  . diltiazem (TIAZAC) 360 MG 24 hr capsule Take 360 mg by mouth daily.    . valsartan-hydrochlorothiazide (DIOVAN-HCT) 320-25 MG per tablet      Review Of Systems: 12 point ROS negative except as noted above in HPI.  Physical Exam: Filed Vitals:   04/17/14 2000  BP: 106/62  Pulse: 85  Temp:   Resp: 31    General: unresponsive  HEENT: pupils fixed Heart: S1, S2 normal, no murmur, rub or gallop, regular rate and rhythm Lungs: clear to auscultation, no wheezes or rales and unlabored breathing Abdomen:RUQ wound-stable, LUG PEG-appear stable, + bowel sounds, + TTP diffusely  Extremities: extremities normal, atraumatic, no cyanosis or edema Skin:no rashes Neurology: only responds to painful stimuli   Labs and Imaging: Lab Results  Component Value Date/Time   NA 174* 04/17/2014 06:08 PM   K 5.9* 04/17/2014 06:08 PM   CL >130* 04/17/2014 06:08 PM   CO2 22 04/17/2014 06:08 PM   BUN 255* 04/17/2014 06:08 PM   CREATININE 3.22* 04/17/2014 06:08 PM   GLUCOSE 712* 04/17/2014 06:08 PM   Lab Results  Component Value Date   WBC 13.8* 04/17/2014   HGB 8.9* 04/17/2014   HCT 34.0* 04/17/2014   MCV 106.9* 04/17/2014   PLT 49* 04/17/2014   Urinalysis    Component Value Date/Time   COLORURINE YELLOW  04/17/2014 1845   APPEARANCEUR TURBID* 04/17/2014 1845   LABSPEC 1.019 04/17/2014 1845   PHURINE 5.0 04/17/2014 1845   GLUCOSEU 100* 04/17/2014 1845   HGBUR LARGE* 04/17/2014 1845   BILIRUBINUR NEGATIVE 04/17/2014 1845   KETONESUR NEGATIVE 04/17/2014 1845   PROTEINUR 30* 04/17/2014 1845   UROBILINOGEN 1.0 04/17/2014 1845   NITRITE NEGATIVE 04/17/2014 1845   LEUKOCYTESUR LARGE* 04/17/2014 1845       Dg Chest Port 1 View  04/17/2014   CLINICAL DATA:  Hypotension for 1 day. Altered mental status. Abnormal laboratory results.  EXAM: PORTABLE CHEST - 1 VIEW  COMPARISON:  None.  FINDINGS: Support apparatus: Tracheostomy is present with the tip at the level of the clavicles. LEFT IJ central line is present with the tip in the mid superior vena cava. Monitoring leads project over the chest.  Cardiomediastinal Silhouette:  Within normal limits.  Lungs: Retrocardiac density likely represents atelectasis. No focal consolidation. No pneumothorax.  Effusions:  None.  Other:  None.  IMPRESSION: 1. Support apparatus appears in good position. 2. No acute cardiopulmonary disease.   Electronically Signed   By: Andreas NewportGeoffrey  Lamke M.D.   On: 04/17/2014 18:07           Doree AlbeeSteven Brigham Cobbins MD  Pager: [phone]

## 2014-04-17 NOTE — ED Notes (Signed)
Spoke to Dr. Arsenio LoaderSommer, reported lactic acid, reviewed low bp's, antibiotics currently ordered. MD akcnowledges allows 1L bolus of NS then repeat lactic acid lab after bolus infused. Level 2 code sepsis.

## 2014-04-18 ENCOUNTER — Inpatient Hospital Stay (HOSPITAL_COMMUNITY): Payer: Medicare Other

## 2014-04-18 ENCOUNTER — Encounter (HOSPITAL_COMMUNITY): Payer: Self-pay | Admitting: *Deleted

## 2014-04-18 DIAGNOSIS — D649 Anemia, unspecified: Secondary | ICD-10-CM | POA: Insufficient documentation

## 2014-04-18 DIAGNOSIS — J9610 Chronic respiratory failure, unspecified whether with hypoxia or hypercapnia: Secondary | ICD-10-CM | POA: Insufficient documentation

## 2014-04-18 LAB — GLUCOSE, CAPILLARY
GLUCOSE-CAPILLARY: 112 mg/dL — AB (ref 70–99)
GLUCOSE-CAPILLARY: 60 mg/dL — AB (ref 70–99)
GLUCOSE-CAPILLARY: 175 mg/dL — AB (ref 70–99)
GLUCOSE-CAPILLARY: 106 mg/dL — AB (ref 70–99)
GLUCOSE-CAPILLARY: 94 mg/dL (ref 70–99)
Glucose-Capillary: 224 mg/dL — ABNORMAL HIGH (ref 70–99)
Glucose-Capillary: 178 mg/dL — ABNORMAL HIGH (ref 70–99)
Glucose-Capillary: 81 mg/dL (ref 70–99)
Glucose-Capillary: 61 mg/dL — ABNORMAL LOW (ref 70–99)
Glucose-Capillary: 90 mg/dL (ref 70–99)
Glucose-Capillary: 99 mg/dL (ref 70–99)

## 2014-04-18 LAB — BASIC METABOLIC PANEL
BUN: 204 mg/dL — ABNORMAL HIGH (ref 6–23)
BUN: 185 mg/dL — ABNORMAL HIGH (ref 6–23)
BUN: 172 mg/dL — AB (ref 6–23)
BUN: 161 mg/dL — AB (ref 6–23)
CO2: 28 mmol/L (ref 19–32)
CO2: 25 mmol/L (ref 19–32)
CO2: 24 mmol/L (ref 19–32)
CO2: 24 mmol/L (ref 19–32)
CREATININE: 2.14 mg/dL — AB (ref 0.50–1.10)
CREATININE: 2.03 mg/dL — AB (ref 0.50–1.10)
Calcium: 9.1 mg/dL (ref 8.4–10.5)
Calcium: 9.3 mg/dL (ref 8.4–10.5)
Calcium: 9.2 mg/dL (ref 8.4–10.5)
Calcium: 9.2 mg/dL (ref 8.4–10.5)
Chloride: >130 mmol/L (ref 96–112)
Creatinine, Ser: 2.32 mg/dL — ABNORMAL HIGH (ref 0.50–1.10)
Creatinine, Ser: 1.99 mg/dL — ABNORMAL HIGH (ref 0.50–1.10)
GFR calc Af Amer: 22 mL/min — ABNORMAL LOW (ref >90)
GFR calc Af Amer: 24 mL/min — ABNORMAL LOW (ref >90)
GFR calc Af Amer: 26 mL/min — ABNORMAL LOW (ref >90)
GFR calc Af Amer: 27 mL/min — ABNORMAL LOW (ref >90)
GFR calc non Af Amer: 19 mL/min — ABNORMAL LOW (ref >90)
GFR calc non Af Amer: 21 mL/min — ABNORMAL LOW (ref >90)
GFR calc non Af Amer: 22 mL/min — ABNORMAL LOW (ref >90)
GFR calc non Af Amer: 23 mL/min — ABNORMAL LOW (ref >90)
GLUCOSE: 70 mg/dL (ref 70–99)
GLUCOSE: 104 mg/dL — AB (ref 70–99)
GLUCOSE: 106 mg/dL — AB (ref 70–99)
Glucose, Bld: 113 mg/dL — ABNORMAL HIGH (ref 70–99)
POTASSIUM: 3.7 mmol/L (ref 3.5–5.1)
Potassium: 3.6 mmol/L (ref 3.5–5.1)
Potassium: 3.4 mmol/L — ABNORMAL LOW (ref 3.5–5.1)
Potassium: 3.4 mmol/L — ABNORMAL LOW (ref 3.5–5.1)
SODIUM: 174 mmol/L — AB (ref 135–145)
Sodium: 179 mmol/L (ref 135–145)
Sodium: 178 mmol/L (ref 135–145)
Sodium: 177 mmol/L (ref 135–145)

## 2014-04-18 LAB — CBC WITH DIFFERENTIAL/PLATELET
Basophils Absolute: 0 10*3/uL (ref 0.0–0.1)
Basophils Relative: 0 % (ref 0–1)
EOS PCT: 1 % (ref 0–5)
Eosinophils Absolute: 0.1 10*3/uL (ref 0.0–0.7)
HCT: 28.9 % — ABNORMAL LOW (ref 36.0–46.0)
Hemoglobin: 7.8 g/dL — ABNORMAL LOW (ref 12.0–15.0)
Lymphocytes Relative: 21 % (ref 12–46)
Lymphs Abs: 2.7 10*3/uL (ref 0.7–4.0)
MCH: 28.2 pg (ref 26.0–34.0)
MCHC: 27 g/dL — ABNORMAL LOW (ref 30.0–36.0)
MCV: 104.3 fL — ABNORMAL HIGH (ref 78.0–100.0)
MONO ABS: 0.5 10*3/uL (ref 0.1–1.0)
Monocytes Relative: 4 % (ref 3–12)
Neutro Abs: 9.7 10*3/uL — ABNORMAL HIGH (ref 1.7–7.7)
Neutrophils Relative %: 74 % (ref 43–77)
PLATELETS: 43 10*3/uL — AB (ref 150–400)
RBC: 2.77 MIL/uL — AB (ref 3.87–5.11)
RDW: 19.2 % — ABNORMAL HIGH (ref 11.5–15.5)
WBC MORPHOLOGY: INCREASED
WBC: 13 10*3/uL — AB (ref 4.0–10.5)

## 2014-04-18 LAB — IRON AND TIBC
Iron: 16 ug/dL — ABNORMAL LOW (ref 42–145)
Saturation Ratios: 12 % — ABNORMAL LOW (ref 20–55)
TIBC: 135 ug/dL — AB (ref 250–470)
UIBC: 119 ug/dL — AB (ref 125–400)

## 2014-04-18 LAB — COMPREHENSIVE METABOLIC PANEL
ALT: 16 U/L (ref 0–35)
AST: 24 U/L (ref 0–37)
Albumin: 1.7 g/dL — ABNORMAL LOW (ref 3.5–5.2)
Alkaline Phosphatase: 76 U/L (ref 39–117)
BUN: 216 mg/dL — ABNORMAL HIGH (ref 6–23)
CALCIUM: 8.9 mg/dL (ref 8.4–10.5)
CO2: 26 mmol/L (ref 19–32)
CREATININE: 2.5 mg/dL — AB (ref 0.50–1.10)
Chloride: >130 mmol/L (ref 96–112)
GFR calc Af Amer: 20 mL/min — ABNORMAL LOW (ref >90)
GFR, EST NON AFRICAN AMERICAN: 17 mL/min — AB (ref >90)
Glucose, Bld: 229 mg/dL — ABNORMAL HIGH (ref 70–99)
Potassium: 3.5 mmol/L (ref 3.5–5.1)
SODIUM: 178 mmol/L — AB (ref 135–145)
TOTAL PROTEIN: 6.6 g/dL (ref 6.0–8.3)
Total Bilirubin: 0.5 mg/dL (ref 0.3–1.2)

## 2014-04-18 LAB — SODIUM, URINE, TIMED: SODIUM UR: 47 mmol/L

## 2014-04-18 LAB — CBG MONITORING, ED
GLUCOSE-CAPILLARY: 271 mg/dL — AB (ref 70–99)
Glucose-Capillary: 345 mg/dL — ABNORMAL HIGH (ref 70–99)

## 2014-04-18 LAB — FERRITIN: Ferritin: 3158 ng/mL — ABNORMAL HIGH (ref 10–291)

## 2014-04-18 LAB — VITAMIN B12: VITAMIN B 12: 561 pg/mL (ref 211–911)

## 2014-04-18 LAB — LACTIC ACID, PLASMA: LACTIC ACID, VENOUS: 1 mmol/L (ref 0.5–2.0)

## 2014-04-18 LAB — FOLATE: Folate: >20 ng/mL

## 2014-04-18 LAB — OSMOLALITY, URINE: OSMOLALITY UR: 541 mosm/kg (ref 390–1090)

## 2014-04-18 LAB — MRSA PCR SCREENING: MRSA by PCR: NEGATIVE

## 2014-04-18 MED ORDER — CHLORHEXIDINE GLUCONATE 0.12 % MT SOLN
15.0000 mL | Freq: Two times a day (BID) | OROMUCOSAL | Status: DC
  Administered 2014-04-18 – 2014-04-22 (×9): 15 mL via OROMUCOSAL
  Filled 2014-04-18 (×9): qty 15

## 2014-04-18 MED ORDER — VANCOMYCIN HCL 500 MG IV SOLR
500.0000 mg | INTRAVENOUS | Status: DC
  Administered 2014-04-18 – 2014-04-19 (×2): 500 mg via INTRAVENOUS
  Filled 2014-04-18 (×3): qty 500

## 2014-04-18 MED ORDER — NOREPINEPHRINE BITARTRATE 1 MG/ML IV SOLN
0.0000 ug/min | INTRAVENOUS | Status: DC
  Administered 2014-04-18: 4 ug/min via INTRAVENOUS
  Administered 2014-04-19: 2.3 ug/min via INTRAVENOUS
  Filled 2014-04-18 (×3): qty 4

## 2014-04-18 MED ORDER — SODIUM CHLORIDE 0.9 % IJ SOLN
10.0000 mL | Freq: Two times a day (BID) | INTRAMUSCULAR | Status: DC
  Administered 2014-04-18: 10 mL
  Administered 2014-04-19: 40 mL
  Administered 2014-04-20: 10 mL

## 2014-04-18 MED ORDER — SODIUM CHLORIDE 0.9 % IJ SOLN
10.0000 mL | INTRAMUSCULAR | Status: DC | PRN
  Administered 2014-04-22: 10 mL
  Filled 2014-04-18: qty 40

## 2014-04-18 MED ORDER — INSULIN ASPART 100 UNIT/ML ~~LOC~~ SOLN
0.0000 [IU] | SUBCUTANEOUS | Status: DC
  Administered 2014-04-20: 2 [IU] via SUBCUTANEOUS
  Administered 2014-04-20: 1 [IU] via SUBCUTANEOUS
  Administered 2014-04-20 – 2014-04-21 (×5): 2 [IU] via SUBCUTANEOUS

## 2014-04-18 MED ORDER — PIPERACILLIN-TAZOBACTAM IN DEX 2-0.25 GM/50ML IV SOLN
2.2500 g | Freq: Three times a day (TID) | INTRAVENOUS | Status: DC
  Administered 2014-04-18 – 2014-04-20 (×6): 2.25 g via INTRAVENOUS
  Filled 2014-04-18 (×9): qty 50

## 2014-04-18 MED ORDER — SODIUM CHLORIDE 0.9 % IV BOLUS (SEPSIS)
1000.0000 mL | Freq: Once | INTRAVENOUS | Status: AC
  Administered 2014-04-18: 1000 mL via INTRAVENOUS

## 2014-04-18 MED ORDER — CETYLPYRIDINIUM CHLORIDE 0.05 % MT LIQD
7.0000 mL | Freq: Two times a day (BID) | OROMUCOSAL | Status: DC
  Administered 2014-04-18 – 2014-04-22 (×10): 7 mL via OROMUCOSAL

## 2014-04-18 MED ORDER — FREE WATER
200.0000 mL | Freq: Four times a day (QID) | Status: DC
  Administered 2014-04-18 – 2014-04-22 (×16): 200 mL

## 2014-04-18 NOTE — Consult Note (Signed)
WOC wound consult note Reason for Consult: Consult requested for right lower chest wound.   Wound type: Full thickness chronic wound Measurement:2X6X1cm Wound bed: 90% beefy red, 10% dark red in the center of the wound. Drainage (amount, consistency, odor) Mod amt yellow drainage, no odor Periwound: Intact skin surrounding Dressing procedure/placement/frequency: Aquacel to absorb drainage and provide antimicrobial benefits.  Foam cover dressing. Please re-consult if further assistance is needed.  Thank-you,  Cammie Mcgeeawn Janan Bogie MSN, RN, CWOCN, [place]CN-AP, CNS [phone]

## 2014-04-18 NOTE — Progress Notes (Signed)
eLink Physician-Brief Progress Note Patient Name: Sierra Kerr DOB: 06/11/1934 MRN: [id number]   Date of Service  04/18/2014  HPI/Events of Note  Na+ = 179.  eICU Interventions  Will change 0.9 NaCl at 125 mL/hour to 0.45 NaCl at 125 mL/hour.      Intervention Category Major Interventions: Electrolyte abnormality - evaluation and management  Sommer,Steven Eugene 04/18/2014, 7:14 AM

## 2014-04-18 NOTE — Progress Notes (Signed)
INITIAL NUTRITION ASSESSMENT  DOCUMENTATION CODES Per approved criteria  -Not Applicable   INTERVENTION: Once pt is able to start nutrition, recommend TF via PEG with Vital 1.5 at 25 ml/h and Prostat 30 ml BID on day 1; on day 2, provide Prostat only once daily and increase to goal rate of 50 ml/h (1200 ml per day) to provide 1900 kcals, 96 gm protein, 912 ml free water daily.  RD to continue to monitor.  NUTRITION DIAGNOSIS: Inadequate oral intake related to inability to eat as evidenced by NPO status.  Goal: Pt to meet >/= 90% of their estimated nutrition needs   Monitor:  TF initiation/tolerance, weight trends, labs, I/O's  Reason for Assessment: Low Braden Score  80 y.o. female  Admitting Dx: <principal problem not specified>  ASSESSMENT: Pt with significant past medical history of IDDM, chronic resp failure s/p trach, atrial fibrillation, UE DVT, septic shock s/p perforated cholecystectomy presenting with sepsis, encephalopathy, hypernatremia, hyperkalemia, AKI, dehydration, UTI, hyperglycemia. Pt with trach collar and PEG.   No family at bedside. During time of visit, pt was unresponsive to questions asked. Unable to obtain nutrition hx. Per MD note, holding off on TF currently. TF recommendations have been given.   Pt with no observed significant fat or muscle mass loss.  Labs: Low GFR. High sodium, chloride, BUN, and creatinine.  Height: Ht Readings from Last 1 Encounters:  04/18/14 5\' 6"  (1.676 m)    Weight: Wt Readings from Last 1 Encounters:  04/18/14 158 lb 8.2 oz (71.9 kg)    Ideal Body Weight: 130 lbs  % Ideal Body Weight: 122%  Wt Readings from Last 10 Encounters:  04/18/14 158 lb 8.2 oz (71.9 kg)    Usual Body Weight: Unknown  % Usual Body Weight: ---  BMI:  Body mass index is 25.6 kg/(m^2).  Estimated Nutritional Needs: Kcal: 1850-2050 Protein: 95-110 grams Fluid: 1.8 - 2 L/day  Skin: Incision on abdomen, non-pitting UE, +1 LE  edema  Diet Order: Diet NPO time specified  EDUCATION NEEDS: -Education not appropriate at this time   Intake/Output Summary (Last 24 hours) at 04/18/14 1018 Last data filed at 04/18/14 0900  Gross per 24 hour  Intake 3377.35 ml  Output    705 ml  Net 2672.35 ml    Last BM: 4/1  Labs:   Recent Labs Lab 04/17/14 2151 04/18/14 0244 04/18/14 0620  NA 174* 178* 179*  K 4.5 3.5 3.7  CL >130* >130* >130*  CO2 23 26 28   BUN 245* 216* 204*  CREATININE 3.03* 2.50* 2.32*  CALCIUM 9.3 8.9 9.1  GLUCOSE 656* 229* 70    CBG (last 3)   Recent Labs  04/18/14 0623 04/18/14 0638 04/18/14 0832  GLUCAP 61* 175* 106*    Scheduled Meds: . antiseptic oral rinse  7 mL Mouth Rinse q12n4p  . chlorhexidine  15 mL Mouth Rinse BID  . insulin aspart  0-9 Units Subcutaneous 6 times per day  . piperacillin-tazobactam (ZOSYN)  IV  3.375 g Intravenous Q8H  . sodium chloride  3 mL Intravenous Q12H  . [START ON 04/19/2014] vancomycin  750 mg Intravenous Q48H    Continuous Infusions: . sodium chloride 125 mL/hr at 04/18/14 0900  . norepinephrine (LEVOPHED) Adult infusion 2 mcg/min (04/18/14 0930)    Past Medical History  Diagnosis Date  . Diabetes mellitus without complication   . Hypertension   . A-fib   . Respiratory failure, acute and chronic   . Sepsis 01/28/14  Perforated choly operated on at [facility]; septic post op.    Past Surgical History  Procedure Laterality Date  . Cholecystectomy    . Tracheostomy    . Peg tube placement      Marijean Niemann, MS, RD, LDN Pager # [phone] After hours/ weekend pager # [phone]

## 2014-04-18 NOTE — Progress Notes (Signed)
eLink Physician-Brief Progress Note Patient Name: Sierra Kerr DOB: 10/28/1934 MRN: [id number]   Date of Service  04/18/2014  HPI/Events of Note  Remains hypotensive s/p most of 1 liter 0.9 NaCl bolus.   eICU Interventions  Will order: 1. Bolus with 0.9 NaCl 1 liter IV over 1 hour now.  2. Norepinephrine IV infusion.     Intervention Category Major Interventions: Sepsis - evaluation and management;Shock - evaluation and management;Hypotension - evaluation and management  Sommer,Steven Dennard Nipugene 04/18/2014, 12:50 AM

## 2014-04-18 NOTE — Progress Notes (Signed)
Critical values noted: Na 178 Chloride >130  Called to E-md Dr. Craige CottaSood.

## 2014-04-18 NOTE — Clinical Social Work Note (Signed)
Clinical Social Worker has assessed patient and pt's family. Full psychosocial assessment to follow.   Jaylen Knope, MSW, LCSWA [phone] 04/18/2014 1:55 PM  

## 2014-04-18 NOTE — Consult Note (Signed)
Requesting Physician:  CCM Reason for Consult:  AKI, hypernatremia HPI: The patient is a 80 y.o. year-old AAF who was transferred from [facility] hospital after developing decreased level of consciousness, hypotension, and labs were drawn that showed creatinine of 2.8, serum sodium of 185, BUN of 245 and glucose of 447. I am not aware what her baseline creatinine is.  Her background is that of DM, HTN, atrial fib, and perforated cholecystitis with septic shock 01/2014 (managed at [facility] She had refractory resp failure resulting in need for trach and PEG 02/2014 and she has been at [facility].  She has an abdominal wound According to the notes her baseline mental status before the current decline "normally responsive and able to speak".  She is currently encephalopathic and unable to speak to me.  She received NS boluses in the ED and then NS infusion. Fluids were changed to 1/2 NS this AM. Serum sodiums remain 178-179. Creatinine has come down some and pt is making urine.    CREATININE, SER  Date/Time Value Ref Range Status  04/18/2014 06:20 AM 2.32* 0.50 - 1.10 mg/dL Final  16/10/9602 54:09 AM 2.50* 0.50 - 1.10 mg/dL Final  81/19/1478 29:56 PM 3.03* 0.50 - 1.10 mg/dL Final  21/30/8657 84:69 PM 3.22* 0.50 - 1.10 mg/dL Final    Past Medical History  Diagnosis Date  . Diabetes mellitus without complication   . Hypertension   . A-fib   . Respiratory failure, acute and chronic   . Sepsis 01/28/14    Perforated choly operated on at [facility]; septic post op.    Past Surgical History:  Past Surgical History  Procedure Laterality Date  . Cholecystectomy    . Tracheostomy    . Peg tube placement      Family History: History reviewed. No pertinent family history. Social History:  has no tobacco, alcohol, and drug history on file.  Allergies: No Known Allergies  Home medications: Prior to Admission medications   Medication Sig Start Date End Date Taking? Authorizing Provider  diltiazem  (TIAZAC) 360 MG 24 hr capsule Take 360 mg by mouth daily. 01/20/14   Historical Provider, MD  valsartan-hydrochlorothiazide (DIOVAN-HCT) 320-25 MG per tablet  01/20/14   Historical Provider, MD    Inpatient medications: . antiseptic oral rinse  7 mL Mouth Rinse q12n4p  . chlorhexidine  15 mL Mouth Rinse BID  . insulin aspart  0-9 Units Subcutaneous 6 times per day  . piperacillin-tazobactam (ZOSYN)  IV  3.375 g Intravenous Q8H  . sodium chloride  3 mL Intravenous Q12H  . [START ON 04/19/2014] vancomycin  750 mg Intravenous Q48H   . sodium chloride 125 mL/hr at 04/18/14 0900  . norepinephrine (LEVOPHED) Adult infusion 2 mcg/min (04/18/14 0930)    Review of Systems Unobtainable as pt lethargic  Physical Exam:  BP 109/44 mmHg  Pulse 79  Temp(Src) 97.6 F (36.4 C) (Oral)  Resp 21  Ht 5\' 6"  (1.676 m)  Wt 71.9 kg (158 lb 8.2 oz)  BMI 25.60 kg/m2  SpO2 100%  Gen: Eyes open and moving about on the bed but does not follow commands. Chronically ill appearing VS as noted Trach in place HR 80's and looks to be in sinus Dressing over RUQ abd wound and over PEG site Neck: Cannot appreciate neck veins Chest: Ant clear Heart: regular, no rub or gallop Abdomen: Right sided dressing (wound not examined) and left upper PEG Ext: No edema. Breakdown heels Neuro: Non-verbal  Labs: Basic Metabolic Panel:  Recent Labs  Lab 04/17/14 1808 04/17/14 2151 04/18/14 0244 04/18/14 0620  NA 174* 174* 178* 179*  K 5.9* 4.5 3.5 3.7  CL >130* >130* >130* >130*  CO2 GLUCOSE 712* 656* 229* 70  BUN 255* 245* 216* 204*  CREATININE 3.22* 3.03* 2.50* 2.32*  CALCIUM 9.9 9.3 8.9 9.1   Liver Function Tests:  Recent Labs Lab 04/17/14 1808 04/17/14 2151 04/18/14 0244  AST ALT ALKPHOS 94 78 76  BILITOT 0.6 0.3 0.5  PROT 7.8 7.1 6.6  ALBUMIN 2.0* 1.9* 1.7*   No results for input(s): LIPASE, AMYLASE in the last 168 hours.  Recent Labs Lab 04/17/14 2151  AMMONIA  20     Recent Labs Lab 04/17/14 1808 04/17/14 2151 04/18/14 0243  WBC 13.8* 11.7* 13.0*  NEUTROABS 9.0*  --  9.7*  HGB 8.9* 8.4* 7.8*  HCT 34.0* 32.4* 28.9*  MCV 106.9* 106.2* 104.3*  PLT 49* 44* 43*    Recent Labs Lab 04/18/14 0509 04/18/14 0617 04/18/14 0623 04/18/14 0638 04/18/14 0832  GLUCAP 81 60* 61* 175* 106*    Xrays/Other Studies: Ct Abdomen Pelvis Wo Contrast  04/17/2014   CLINICAL DATA:  Hypotension, abnormal labs. Chronic respiratory failure.  EXAM: CT CHEST, ABDOMEN AND PELVIS WITHOUT CONTRAST  TECHNIQUE: Multidetector CT imaging of the chest, abdomen and pelvis was performed following the standard protocol without IV contrast.  COMPARISON:  Chest radiograph earlier this day.  No remote imaging.  FINDINGS: CT CHEST FINDINGS  Tracheostomy tube with tip at the thoracic inlet. Tip of the left central line in the SVC. There is dense airspace consolidation in the dependent right lower lobe. Detailed evaluation is limited by a motion artifact, no definite obstructing lesion is seen. Nodular and confluent opacity in the dependent left lower lobe to a lesser extent. 6 mm spiculated nodule at the right lung apex. Emphysematous change with scattered blebs.  No definite pleural effusion. No pericardial effusion. The heart is at the upper limits of normal. Mild coronary artery calcifications. There are calcified bilateral hilar lymph and subcarinal nodes. Mild atherosclerosis of the thoracic aorta, no aneurysm.  CT ABDOMEN AND PELVIS FINDINGS  Evaluation of the abdomen is limited given lack of contrast, arms down positioning, and mild patient motion. Gastrostomy tube is seen in the stomach. There are no dilated or thickened bowel loops. Multiple colonic diverticular primarily in the distal descending and sigmoid colon without diverticulitis. There is faint fat stranding in the left pericolic gutter, no associated bowel abnormalities. The appendix is prominent in size measuring 8 mm,  however there is no surrounding inflammatory change.  The unenhanced liver, pancreas, and adrenal glands are normal. Small splenic granuloma, spleen is normal in size. Gallbladder is not seen, surgical clips tentatively seen in the gallbladder fossa.  Kidneys are symmetric in size. Mild prominence of both renal pelvis these without hydronephrosis. No significant perinephric stranding. No renal stones. There is a 3.1 x 2.3 cm hypodensity in the lower right kidney with Hounsfield units of 26.  Abdominal aorta is normal in caliber mild atherosclerosis. No retroperitoneal adenopathy. No free air, free fluid, or intra-abdominal fluid collection.  Within the pelvis there are calcified uterine fibroids. Urinary bladder is decompressed by Foley catheter. There is question of perivesicular inflammatory change. No large adnexal mass. No pelvic free fluid.  Scattered soft tissue densities and air in the anterior abdominal wall, commonly seen with subcutaneous injections.  Review of the osseous structures of  the chest abdomen and pelvis demonstrates no acute or suspicious osseous abnormality. There is multilevel degenerative change throughout the thoracic and lumbar spine. Degenerative change noted about both sacroiliac joints.  IMPRESSION: 1. Bilateral lower lobe airspace consolidation, right greater than left. This may reflect pneumonia, however aspiration could have a similar appearance given distribution. 2. Spiculated 6 mm nodule in the right lung apex. Comparison with any prior imaging would be most helpful as this is a common location for scarring. In the absence of prior imaging, if the patient is at high risk for bronchogenic carcinoma, follow-up chest CT at 6-12 months is recommended. If the patient is at low risk for bronchogenic carcinoma, follow-up chest CT at 12 months is recommended. This recommendation follows the consensus statement: Guidelines for Management of Small Pulmonary Nodules Detected on CT Scans: A  Statement from the Fleischner Society as published in Radiology 2005;237:395-400. 3. Sequela of prior granulomatous disease with calcified bilateral hilar and subcarinal lymph nodes. Scattered emphysematous blebs. 4. Foley catheter within the urinary bladder, however suggestion of perivesicular inflammatory change. Findings suggest urinary tract infection. There is mild soft tissue edema in the left pericolic gutter, without subjacent inflammation of the colon or bowel loops, may reflect tracking inflammation. 5. Hypodense lesion in the lower right kidney measuring 3.1 cm. This does not measures simple fluid density, however is incompletely characterized without contrast. Correlation with any prior imaging to evaluate for size stability recommended. In the absence of prior imaging, further characterization is necessary. Initial evaluation could be considered in the setting of renal failure with ultrasound. 6. Incidental findings of diverticulosis. No diverticulitis. The appendix appears prominent in size, however there is no surrounding inflammatory change and this is likely normal for this patient.   Electronically Signed   By: Rubye OaksMelanie  Ehinger M.D.   On: 04/17/2014 21:25   Ct Head Wo Contrast  04/17/2014   CLINICAL DATA:  Decreased level of consciousness. Altered mental status. Initial encounter.  EXAM: CT HEAD WITHOUT CONTRAST  TECHNIQUE: Contiguous axial images were obtained from the base of the skull through the vertex without intravenous contrast.  COMPARISON:  None.  FINDINGS: No mass lesion, mass effect, midline shift, hydrocephalus, hemorrhage. No acute territorial cortical ischemia/infarct. Atrophy. Hyperostosis of the skull is present. Mild motion artifact is present on the examination. Paranasal sinuses appear within normal limits. Tiny area of low attenuation is present in the LEFT brainstem, probably representing a lacunar infarct.  IMPRESSION: Atrophy without acute intracranial abnormality.    Electronically Signed   By: Andreas NewportGeoffrey  Lamke M.D.   On: 04/17/2014 21:36   Ct Chest Wo Contrast  04/17/2014   CLINICAL DATA:  Hypotension, abnormal labs. Chronic respiratory failure.  EXAM: CT CHEST, ABDOMEN AND PELVIS WITHOUT CONTRAST  TECHNIQUE: Multidetector CT imaging of the chest, abdomen and pelvis was performed following the standard protocol without IV contrast.  COMPARISON:  Chest radiograph earlier this day.  No remote imaging.  FINDINGS: CT CHEST FINDINGS  Tracheostomy tube with tip at the thoracic inlet. Tip of the left central line in the SVC. There is dense airspace consolidation in the dependent right lower lobe. Detailed evaluation is limited by a motion artifact, no definite obstructing lesion is seen. Nodular and confluent opacity in the dependent left lower lobe to a lesser extent. 6 mm spiculated nodule at the right lung apex. Emphysematous change with scattered blebs.  No definite pleural effusion. No pericardial effusion. The heart is at the upper limits of normal. Mild coronary artery calcifications. There  are calcified bilateral hilar lymph and subcarinal nodes. Mild atherosclerosis of the thoracic aorta, no aneurysm.  CT ABDOMEN AND PELVIS FINDINGS  Evaluation of the abdomen is limited given lack of contrast, arms down positioning, and mild patient motion. Gastrostomy tube is seen in the stomach. There are no dilated or thickened bowel loops. Multiple colonic diverticular primarily in the distal descending and sigmoid colon without diverticulitis. There is faint fat stranding in the left pericolic gutter, no associated bowel abnormalities. The appendix is prominent in size measuring 8 mm, however there is no surrounding inflammatory change.  The unenhanced liver, pancreas, and adrenal glands are normal. Small splenic granuloma, spleen is normal in size. Gallbladder is not seen, surgical clips tentatively seen in the gallbladder fossa.  Kidneys are symmetric in size. Mild prominence of both  renal pelvis these without hydronephrosis. No significant perinephric stranding. No renal stones. There is a 3.1 x 2.3 cm hypodensity in the lower right kidney with Hounsfield units of 26.  Abdominal aorta is normal in caliber mild atherosclerosis. No retroperitoneal adenopathy. No free air, free fluid, or intra-abdominal fluid collection.  Within the pelvis there are calcified uterine fibroids. Urinary bladder is decompressed by Foley catheter. There is question of perivesicular inflammatory change. No large adnexal mass. No pelvic free fluid.  Scattered soft tissue densities and air in the anterior abdominal wall, commonly seen with subcutaneous injections.  Review of the osseous structures of the chest abdomen and pelvis demonstrates no acute or suspicious osseous abnormality. There is multilevel degenerative change throughout the thoracic and lumbar spine. Degenerative change noted about both sacroiliac joints.  IMPRESSION: 1. Bilateral lower lobe airspace consolidation, right greater than left. This may reflect pneumonia, however aspiration could have a similar appearance given distribution. 2. Spiculated 6 mm nodule in the right lung apex. Comparison with any prior imaging would be most helpful as this is a common location for scarring. In the absence of prior imaging, if the patient is at high risk for bronchogenic carcinoma, follow-up chest CT at 6-12 months is recommended. If the patient is at low risk for bronchogenic carcinoma, follow-up chest CT at 12 months is recommended. This recommendation follows the consensus statement: Guidelines for Management of Small Pulmonary Nodules Detected on CT Scans: A Statement from the Fleischner Society as published in Radiology 2005;237:395-400. 3. Sequela of prior granulomatous disease with calcified bilateral hilar and subcarinal lymph nodes. Scattered emphysematous blebs. 4. Foley catheter within the urinary bladder, however suggestion of perivesicular inflammatory  change. Findings suggest urinary tract infection. There is mild soft tissue edema in the left pericolic gutter, without subjacent inflammation of the colon or bowel loops, may reflect tracking inflammation. 5. Hypodense lesion in the lower right kidney measuring 3.1 cm. This does not measures simple fluid density, however is incompletely characterized without contrast. Correlation with any prior imaging to evaluate for size stability recommended. In the absence of prior imaging, further characterization is necessary. Initial evaluation could be considered in the setting of renal failure with ultrasound. 6. Incidental findings of diverticulosis. No diverticulitis. The appendix appears prominent in size, however there is no surrounding inflammatory change and this is likely normal for this patient.   Electronically Signed   By: Rubye Oaks M.D.   On: 04/17/2014 21:25   US Renal  04/18/2014   CLINICAL DATA:  Acute renal failure.  EXAM: RENAL/URINARY TRACT ULTRASOUND COMPLETE  COMPARISON:  Noncontrast CT 4 hr prior.  FINDINGS: Right Kidney:  Length: 11.1 cm. There is thinning of the  renal parenchyma and increased renal echogenicity. No hydronephrosis. Small hypoechoic structures consistent with cysts, largest in the lower pole measuring 2.2 x 2.7 x 2.5 cm corresponding to questioned abnormality on CT.  Left Kidney:  Length: 11.2 cm. Increased renal echogenicity and thinning of renal parenchyma. No mass or hydronephrosis visualized.  Bladder:  Decompressed by Foley catheter and not assessed.  IMPRESSION: 1. Echogenic kidneys with thinning of the renal parenchyma consistent with chronic medical renal disease. No obstructive uropathy. 2. Right renal cysts, largest in the lower pole measuring 2.7 cm. This corresponds to the indeterminate abnormality on CT.   Electronically Signed   By: Rubye Oaks M.D.   On: 04/18/2014 02:07   Dg Chest Port 1 View  04/18/2014   CLINICAL DATA:  Code sepsis.  Pneumonia.  EXAM:  PORTABLE CHEST - 1 VIEW  COMPARISON:  Chest CT 3 hr prior.  Chest radiographs 7 hr prior.  FINDINGS: Bibasilar consolidations, better appreciated on prior CT. Worsening of left lower lobe aeration from prior radiograph. Tracheostomy tube remains in place. Left internal jugular central venous catheter tip in the SVC. The heart size is normal. Minimal blunting of right costophrenic angle consistent with effusion. No pneumothorax.  IMPRESSION: Bibasilar consolidations, better appreciated on chest CT. Left lower lobe aeration has worsened from prior chest radiograph.   Electronically Signed   By: Rubye Oaks M.D.   On: 04/18/2014 00:42   Dg Chest Port 1 View  04/17/2014   CLINICAL DATA:  Hypotension for 1 day. Altered mental status. Abnormal laboratory results.  EXAM: PORTABLE CHEST - 1 VIEW  COMPARISON:  None.  FINDINGS: Support apparatus: Tracheostomy is present with the tip at the level of the clavicles. LEFT IJ central line is present with the tip in the mid superior vena cava. Monitoring leads project over the chest.  Cardiomediastinal Silhouette:  Within normal limits.  Lungs: Retrocardiac density likely represents atelectasis. No focal consolidation. No pneumothorax.  Effusions:  None.  Other:  None.  IMPRESSION: 1. Support apparatus appears in good position. 2. No acute cardiopulmonary disease.   Electronically Signed   By: Andreas Newport M.D.   On: 04/17/2014 18:07   Background: 81 yo AAF with background of DM, HTN, atrial fib, and perforated cholecystitis with septic shock 01/2014 (managed at [facility]). She had refractory resp failure resulting in need for trach and PEG 02/2014 and she has been at [facility]. She was transferred to [facility] after developing decreased level of consciousness, hypotension, and labs were drawn that showed creatinine of 2.8, serum sodium of 185, BUN of 245 and glucose of 447. I am not aware what her baseline creatinine is.   Impression/Plan 1. Hypernatremia - current getting 1/2  isotonic fluids. Pt is making urine and BP has improved. She has a significant water deficit and her sodium has INCREASED since admission (although this in part is a reflection of correcting her hyperglycemia so in fact may have decreased just a little if we account for that since her "correctoed" sodium on admission was in the 180's). I understand the wish to avoid dextrose containing fluids. She has a PEG tube. Recommend initiation of water via tube.  Because by definition this is chronic hypernatremia (present for over 48 hours) correct should occur at a rate of around 10-12 mEq/24 hours (0.4-0.5 mEq/L/hour max)  2. AKI - creatinine is falling. I do not know baseline. Pt is non-oliguric 3. DM with hyperglycemia - improving BS control 4. Hyperkalemia - resolved 5. Chronic resp failure -  per CCM 6. ID - possible sources of sepsis include urine/PNA/abd wound - on vanco and zosyn per CCM 7. Encephalopathy  - undoubtedly related to her metabolic disarray.   Camille Bal,  MD [facility] [phone] pager 04/18/2014, 10:37 AM

## 2014-04-18 NOTE — Care Management Note (Signed)
    Page 1 of 1   04/18/2014     12:11:39 PM CARE MANAGEMENT NOTE 04/18/2014  Patient:  Sierra Kerr,Sierra Kerr   Account Number:  [id number]  Date Initiated:  04/18/2014  Documentation initiated by:  Avie ArenasBROWN,Tige Meas  Subjective/Objective Assessment:   Admitted from SNF at [facility] with Sepsis.     Action/Plan:   Anticipated DC Date:  04/21/2014   Anticipated DC Plan:  SKILLED NURSING FACILITY  In-house referral  Clinical Social Worker      DC Planning Services  CM consult      Choice offered to / List presented to:             Status of service:  In process, will continue to follow Medicare Important Message given?   (If response is "NO", the following Medicare IM given date fields will be blank) Date Medicare IM given:   Medicare IM given by:   Date Additional Medicare IM given:   Additional Medicare IM given by:    Discharge Disposition:    Per UR Regulation:  Reviewed for med. necessity/level of care/duration of stay  If discussed at Long Length of Stay Meetings, dates discussed:    Comments:  04-18-14 12:05pm Avie ArenasSarah Lorely Bubb, RNBSN [phone] Has [place] days if needs higher level.  Per physician feels will be able to go back to SNF.  SW referral made.  Per SW [facility] can take patient back and can do over w/e if needed.

## 2014-04-18 NOTE — Progress Notes (Signed)
PULMONARY / CRITICAL CARE MEDICINE   Name: Sierra Kerr MRN: [id number] DOB: 1934-10-04    ADMISSION DATE:  04/17/2014 CONSULTATION DATE:  04/17/2014  REFERRING MD :  Lynelle Doctor EDP  CHIEF COMPLAINT:  AMS  INITIAL PRESENTATION: 80 year old female with trach/chronic vent presented to [facility] ED 3/31 from [facility] with complaints of AMS, hyperkalemia, and hypernatremia.   STUDIES:  3/31 CT head> Atrophy without acute intracranial abnormality. 3/31 CT chest/a/p > b/l lower lobe airspace consolidation, spiculated 6mm nodule R lung apex. Edema suggesting UTI, Hypodense lesion R kidney.  SIGNIFICANT EVENTS: 1/13 . Admitted to [facility] for cholecystis with perforation, septic shock, intubated. To OR 2/5 Trach/PEG 2/23 > to [facility] 3/31 > AMS, to ED  HISTORY OF PRESENT ILLNESS:  80 year old female with PMH DM, and PAF. Was admitted to [facility] for septic shock  For perforated cholecystitis with surgical repair 01/29/2014. She was an mechanical ventilator for the entirety of her stay, which was complicated by AF RVR, C-diff, and refractory respiratory failure which ultimately resulted in trach/PEG 2/5. Also had upper extremity DVT from line, however unable to tolerate anticoagulation due to hemoptysis. She was admitted to [facility] 2/23. Since her admission there she has shown minimal functional improvement, however if sounds as though she has progressed to tolerate some capping trials with trach. Reportedly was recannulated 3/31 due to following, She had decreased level of responsiveness (baseline appears to be some movement of lower extremities and significant non-purposeful movement of upper arms, and grimacing to pain). Lab work was sent and noted profound hypernatremia, hyperkalemia, and hyperglycemia.  She was sent to ED for eval.     SUBJECTIVE: Remains unresponsive Afebrile Good UO   VITAL SIGNS: Temp:  [94.8 F (34.9 C)-97.6 F (36.4 C)] 97.6 F (36.4 C) (04/01 1240) Pulse Rate:  [56-137] 77 (04/01  1340) Resp:  [14-33] 14 (04/01 1340) BP: (78-135)/(34-94) 109/44 mmHg (04/01 0900) SpO2:  [95 %-100 %] 100 % (04/01 1340) FiO2 (%):  [28 %-35 %] 35 % (04/01 1340) Weight:  [54.432 kg (120 lb)-71.9 kg (158 lb 8.2 oz)] 71.9 kg (158 lb 8.2 oz) (04/01 0145) HEMODYNAMICS:   VENTILATOR SETTINGS: Vent Mode:  [-]  FiO2 (%):  [28 %-35 %] 35 % INTAKE / OUTPUT:  Intake/Output Summary (Last 24 hours) at 04/18/14 1415 Last data filed at 04/18/14 0900  Gross per 24 hour  Intake 3377.35 ml  Output    705 ml  Net 2672.35 ml    PHYSICAL EXAMINATION: General:  Female chronically ill appearing in NAD Neuro:  Alert, grimace to pain HEENT:  [place]/AT, no JVD, trach in place, PERRL Cardiovascular:   irreg irreg Lungs:  Diminished BL bases Abdomen:  Soft, non-distended, remote abd surgical wound. Musculoskeletal:  No acute deformity Skin:  Grossly intact  LABS:  CBC  Recent Labs Lab 04/17/14 1808 04/17/14 2151 04/18/14 0243  WBC 13.8* 11.7* 13.0*  HGB 8.9* 8.4* 7.8*  HCT 34.0* 32.4* 28.9*  PLT 49* 44* 43*   Coag's  Recent Labs Lab 04/17/14 2151  APTT 38*  INR 1.51*   BMET  Recent Labs Lab 04/17/14 2151 04/18/14 0244 04/18/14 0620  NA 174* 178* 179*  K 4.5 3.5 3.7  CL >130* >130* >130*  CO2 23 26 28   BUN 245* 216* 204*  CREATININE 3.03* 2.50* 2.32*  GLUCOSE 656* 229* 70   Electrolytes  Recent Labs Lab 04/17/14 2151 04/18/14 0244 04/18/14 0620  CALCIUM 9.3 8.9 9.1   Sepsis Markers  Recent Labs Lab  04/17/14 2151 04/17/14 2209 04/18/14 0620  LATICACIDVEN 3.6* 3.33* 1.0  PROCALCITON 5.93  --   --    ABG  Recent Labs Lab 04/17/14 1834 04/17/14 2317  PHART 7.344* 7.322*  PCO2ART 45.7* 41.3  PO2ART 44.0* 70.0*   Liver Enzymes  Recent Labs Lab 04/17/14 1808 04/17/14 2151 04/18/14 0244  AST ALT ALKPHOS 94 78 76  BILITOT 0.6 0.3 0.5  ALBUMIN 2.0* 1.9* 1.7*   Cardiac Enzymes No results for input(s): TROPONINI, PROBNP in the  last 168 hours. Glucose  Recent Labs Lab 04/18/14 0509 04/18/14 0617 04/18/14 0623 04/18/14 0638 04/18/14 0832 04/18/14 1239  GLUCAP 81 60* 61* 175* 106* 90    Imaging Ct Abdomen Pelvis Wo Contrast  04/17/2014   CLINICAL DATA:  Hypotension, abnormal labs. Chronic respiratory failure.  EXAM: CT CHEST, ABDOMEN AND PELVIS WITHOUT CONTRAST  TECHNIQUE: Multidetector CT imaging of the chest, abdomen and pelvis was performed following the standard protocol without IV contrast.  COMPARISON:  Chest radiograph earlier this day.  No remote imaging.  FINDINGS: CT CHEST FINDINGS  Tracheostomy tube with tip at the thoracic inlet. Tip of the left central line in the SVC. There is dense airspace consolidation in the dependent right lower lobe. Detailed evaluation is limited by a motion artifact, no definite obstructing lesion is seen. Nodular and confluent opacity in the dependent left lower lobe to a lesser extent. 6 mm spiculated nodule at the right lung apex. Emphysematous change with scattered blebs.  No definite pleural effusion. No pericardial effusion. The heart is at the upper limits of normal. Mild coronary artery calcifications. There are calcified bilateral hilar lymph and subcarinal nodes. Mild atherosclerosis of the thoracic aorta, no aneurysm.  CT ABDOMEN AND PELVIS FINDINGS  Evaluation of the abdomen is limited given lack of contrast, arms down positioning, and mild patient motion. Gastrostomy tube is seen in the stomach. There are no dilated or thickened bowel loops. Multiple colonic diverticular primarily in the distal descending and sigmoid colon without diverticulitis. There is faint fat stranding in the left pericolic gutter, no associated bowel abnormalities. The appendix is prominent in size measuring 8 mm, however there is no surrounding inflammatory change.  The unenhanced liver, pancreas, and adrenal glands are normal. Small splenic granuloma, spleen is normal in size. Gallbladder is not  seen, surgical clips tentatively seen in the gallbladder fossa.  Kidneys are symmetric in size. Mild prominence of both renal pelvis these without hydronephrosis. No significant perinephric stranding. No renal stones. There is a 3.1 x 2.3 cm hypodensity in the lower right kidney with Hounsfield units of 26.  Abdominal aorta is normal in caliber mild atherosclerosis. No retroperitoneal adenopathy. No free air, free fluid, or intra-abdominal fluid collection.  Within the pelvis there are calcified uterine fibroids. Urinary bladder is decompressed by Foley catheter. There is question of perivesicular inflammatory change. No large adnexal mass. No pelvic free fluid.  Scattered soft tissue densities and air in the anterior abdominal wall, commonly seen with subcutaneous injections.  Review of the osseous structures of the chest abdomen and pelvis demonstrates no acute or suspicious osseous abnormality. There is multilevel degenerative change throughout the thoracic and lumbar spine. Degenerative change noted about both sacroiliac joints.  IMPRESSION: 1. Bilateral lower lobe airspace consolidation, right greater than left. This may reflect pneumonia, however aspiration could have a similar appearance given distribution. 2. Spiculated 6 mm nodule in the right lung apex. Comparison with any prior imaging would  be most helpful as this is a common location for scarring. In the absence of prior imaging, if the patient is at high risk for bronchogenic carcinoma, follow-up chest CT at 6-12 months is recommended. If the patient is at low risk for bronchogenic carcinoma, follow-up chest CT at 12 months is recommended. This recommendation follows the consensus statement: Guidelines for Management of Small Pulmonary Nodules Detected on CT Scans: A Statement from the Fleischner Society as published in Radiology 2005;237:395-400. 3. Sequela of prior granulomatous disease with calcified bilateral hilar and subcarinal lymph nodes.  Scattered emphysematous blebs. 4. Foley catheter within the urinary bladder, however suggestion of perivesicular inflammatory change. Findings suggest urinary tract infection. There is mild soft tissue edema in the left pericolic gutter, without subjacent inflammation of the colon or bowel loops, may reflect tracking inflammation. 5. Hypodense lesion in the lower right kidney measuring 3.1 cm. This does not measures simple fluid density, however is incompletely characterized without contrast. Correlation with any prior imaging to evaluate for size stability recommended. In the absence of prior imaging, further characterization is necessary. Initial evaluation could be considered in the setting of renal failure with ultrasound. 6. Incidental findings of diverticulosis. No diverticulitis. The appendix appears prominent in size, however there is no surrounding inflammatory change and this is likely normal for this patient.   Electronically Signed   By: Rubye Oaks M.D.   On: 04/17/2014 21:25   Ct Head Wo Contrast  04/17/2014   CLINICAL DATA:  Decreased level of consciousness. Altered mental status. Initial encounter.  EXAM: CT HEAD WITHOUT CONTRAST  TECHNIQUE: Contiguous axial images were obtained from the base of the skull through the vertex without intravenous contrast.  COMPARISON:  None.  FINDINGS: No mass lesion, mass effect, midline shift, hydrocephalus, hemorrhage. No acute territorial cortical ischemia/infarct. Atrophy. Hyperostosis of the skull is present. Mild motion artifact is present on the examination. Paranasal sinuses appear within normal limits. Tiny area of low attenuation is present in the LEFT brainstem, probably representing a lacunar infarct.  IMPRESSION: Atrophy without acute intracranial abnormality.   Electronically Signed   By: Andreas Newport M.D.   On: 04/17/2014 21:36   Ct Chest Wo Contrast  04/17/2014   CLINICAL DATA:  Hypotension, abnormal labs. Chronic respiratory failure.   EXAM: CT CHEST, ABDOMEN AND PELVIS WITHOUT CONTRAST  TECHNIQUE: Multidetector CT imaging of the chest, abdomen and pelvis was performed following the standard protocol without IV contrast.  COMPARISON:  Chest radiograph earlier this day.  No remote imaging.  FINDINGS: CT CHEST FINDINGS  Tracheostomy tube with tip at the thoracic inlet. Tip of the left central line in the SVC. There is dense airspace consolidation in the dependent right lower lobe. Detailed evaluation is limited by a motion artifact, no definite obstructing lesion is seen. Nodular and confluent opacity in the dependent left lower lobe to a lesser extent. 6 mm spiculated nodule at the right lung apex. Emphysematous change with scattered blebs.  No definite pleural effusion. No pericardial effusion. The heart is at the upper limits of normal. Mild coronary artery calcifications. There are calcified bilateral hilar lymph and subcarinal nodes. Mild atherosclerosis of the thoracic aorta, no aneurysm.  CT ABDOMEN AND PELVIS FINDINGS  Evaluation of the abdomen is limited given lack of contrast, arms down positioning, and mild patient motion. Gastrostomy tube is seen in the stomach. There are no dilated or thickened bowel loops. Multiple colonic diverticular primarily in the distal descending and sigmoid colon without diverticulitis. There is faint  fat stranding in the left pericolic gutter, no associated bowel abnormalities. The appendix is prominent in size measuring 8 mm, however there is no surrounding inflammatory change.  The unenhanced liver, pancreas, and adrenal glands are normal. Small splenic granuloma, spleen is normal in size. Gallbladder is not seen, surgical clips tentatively seen in the gallbladder fossa.  Kidneys are symmetric in size. Mild prominence of both renal pelvis these without hydronephrosis. No significant perinephric stranding. No renal stones. There is a 3.1 x 2.3 cm hypodensity in the lower right kidney with Hounsfield units of  26.  Abdominal aorta is normal in caliber mild atherosclerosis. No retroperitoneal adenopathy. No free air, free fluid, or intra-abdominal fluid collection.  Within the pelvis there are calcified uterine fibroids. Urinary bladder is decompressed by Foley catheter. There is question of perivesicular inflammatory change. No large adnexal mass. No pelvic free fluid.  Scattered soft tissue densities and air in the anterior abdominal wall, commonly seen with subcutaneous injections.  Review of the osseous structures of the chest abdomen and pelvis demonstrates no acute or suspicious osseous abnormality. There is multilevel degenerative change throughout the thoracic and lumbar spine. Degenerative change noted about both sacroiliac joints.  IMPRESSION: 1. Bilateral lower lobe airspace consolidation, right greater than left. This may reflect pneumonia, however aspiration could have a similar appearance given distribution. 2. Spiculated 6 mm nodule in the right lung apex. Comparison with any prior imaging would be most helpful as this is a common location for scarring. In the absence of prior imaging, if the patient is at high risk for bronchogenic carcinoma, follow-up chest CT at 6-12 months is recommended. If the patient is at low risk for bronchogenic carcinoma, follow-up chest CT at 12 months is recommended. This recommendation follows the consensus statement: Guidelines for Management of Small Pulmonary Nodules Detected on CT Scans: A Statement from the Fleischner Society as published in Radiology 2005;237:395-400. 3. Sequela of prior granulomatous disease with calcified bilateral hilar and subcarinal lymph nodes. Scattered emphysematous blebs. 4. Foley catheter within the urinary bladder, however suggestion of perivesicular inflammatory change. Findings suggest urinary tract infection. There is mild soft tissue edema in the left pericolic gutter, without subjacent inflammation of the colon or bowel loops, may reflect  tracking inflammation. 5. Hypodense lesion in the lower right kidney measuring 3.1 cm. This does not measures simple fluid density, however is incompletely characterized without contrast. Correlation with any prior imaging to evaluate for size stability recommended. In the absence of prior imaging, further characterization is necessary. Initial evaluation could be considered in the setting of renal failure with ultrasound. 6. Incidental findings of diverticulosis. No diverticulitis. The appendix appears prominent in size, however there is no surrounding inflammatory change and this is likely normal for this patient.   Electronically Signed   By: Rubye Oaks M.D.   On: 04/17/2014 21:25   Dg Chest Port 1 View  04/17/2014   CLINICAL DATA:  Hypotension for 1 day. Altered mental status. Abnormal laboratory results.  EXAM: PORTABLE CHEST - 1 VIEW  COMPARISON:  None.  FINDINGS: Support apparatus: Tracheostomy is present with the tip at the level of the clavicles. LEFT IJ central line is present with the tip in the mid superior vena cava. Monitoring leads project over the chest.  Cardiomediastinal Silhouette:  Within normal limits.  Lungs: Retrocardiac density likely represents atelectasis. No focal consolidation. No pneumothorax.  Effusions:  None.  Other:  None.  IMPRESSION: 1. Support apparatus appears in good position. 2. No acute cardiopulmonary  disease.   Electronically Signed   By: Andreas NewportGeoffrey  Lamke M.D.   On: 04/17/2014 18:07     ASSESSMENT / PLAN:  PULMONARY Trach 2/5  [facility] > A: A on C hypoxemic respiratory failure Lung nodule -needs outpt FU in 6mnths depending on overall prognosis RLL PNA Trach status  P:   Continue ATC 35% titrate to SpO2 > 92% May need cuffed trach placed and vent support    CARDIOVASCULAR CVL [facility] pta > A:  Hypotension - suspect hypovolemia/sepsis Atrial fib - RVR QTc prolongation Elevated lactic acid  P:  Telemetry monitoring Hold outpatient  antihypertensives Volume per renal section Amio if needed for rhythm control   RENAL A:   AKI R Renal lesion Hypernatremia (corrects to 187) - Hospitalist and nephrology conversation agree likely due to dehydration.  Hyperkalemia Renal US 4/1  -chronic medical renal disease, right renal cysts P:   Q 6 hour Bmet 1/2 NS 125/hr for now Target Na drop 0.5-1/hr- about 12/ day    GASTROINTESTINAL A:   H/o cholecystis with perforation PEG  P:   Resume TF @ 20/h SUP: IV protonix  HEMATOLOGIC A:   Anemia Thrombocytopenia  P:  Monitor CBC Transfuse per ICU guidlines  INFECTIOUS A:   Severe Sepsis, suspect UTI source although abd wound, PNA, CVL also considered.   P:   BCx2 3/31 > UC 3/31 > Sputum 3/31 > Abx: Zosyn, start date 3/31> Abx: Vancomycin, start date 3/31> PCT Follow WBC and fever curve  ENDOCRINE A:   Hyperglycemia with history of DM HHNK    P:   Insulin gtt off- transition to SSI CBG monitoring  NEUROLOGIC A:   Acute on chronic encephalopathy  P:   RASS goal: 0 Monitor   FAMILY  - Updates: none available  - Inter-disciplinary family meet or Palliative Care meeting due by:  4/6  The patient is critically ill with multiple organ systems failure and requires high complexity decision making for assessment and support, frequent evaluation and titration of therapies, application of advanced monitoring technologies and extensive interpretation of multiple databases. Critical Care Time devoted to patient care services described in this note independent of APP time is 35 minutes.   Oretha MilchALVA,RAKESH V. MD  04/18/2014 2:15 PM

## 2014-04-18 NOTE — Progress Notes (Signed)
UR Completed.  [phone]  

## 2014-04-18 NOTE — Progress Notes (Addendum)
ANTIBIOTIC CONSULT NOTE - FOLLOW UP  Pharmacy Consult for vancomycin, Zosyn Indication: rule out sepsis  No Known Allergies  Patient Measurements: Height: 5\' 6"  (167.6 cm) Weight: 158 lb 8.2 oz (71.9 kg) IBW/kg (Calculated) : 59.3  Vital Signs: Temp: 97.6 F (36.4 C) (04/01 1240) Temp Source: Oral (04/01 1240) BP: 111/28 mmHg (04/01 1500) Pulse Rate: 79 (04/01 1500) Intake/Output from previous day: 03/31 0701 - 04/01 0700 In: 3097.4 [I.V.:3047.4; IV Piggyback:50] Out: 705 [Urine:705] Intake/Output from this shift: Total I/O In: 1176.9 [I.V.:1076.9; IV Piggyback:100] Out: -   Labs:  Recent Labs  04/17/14 1808 04/17/14 2151 04/18/14 0243 04/18/14 0244 04/18/14 0620 04/18/14 1044  WBC 13.8* 11.7* 13.0*  --   --   --   HGB 8.9* 8.4* 7.8*  --   --   --   PLT 49* 44* 43*  --   --   --   CREATININE 3.22* 3.03*  --  2.50* 2.32* 2.14*   Estimated Creatinine Clearance: 22.3 mL/min (by C-G formula based on Cr of 2.14). No results for input(s): VANCOTROUGH, VANCOPEAK, VANCORANDOM, GENTTROUGH, GENTPEAK, GENTRANDOM, TOBRATROUGH, TOBRAPEAK, TOBRARND, AMIKACINPEAK, AMIKACINTROU, AMIKACIN in the last 72 hours.   Microbiology: Recent Results (from the past 720 hour(s))  MRSA PCR Screening     Status: None   Collection Time: 04/18/14  2:55 AM  Result Value Ref Range Status   MRSA by PCR NEGATIVE NEGATIVE Final    Comment:        The GeneXpert MRSA Assay (FDA approved for NASAL specimens only), is one component of a comprehensive MRSA colonization surveillance program. It is not intended to diagnose MRSA infection nor to guide or monitor treatment for MRSA infections.     Anti-infectives    Start     Dose/Rate Route Frequency Ordered Stop   04/19/14 2330  vancomycin (VANCOCIN) IVPB 750 mg/150 ml premix  Status:  Discontinued     750 mg 150 mL/hr over 60 Minutes Intravenous Every 48 hours 04/17/14 2313 04/18/14 1239   04/18/14 2100  vancomycin (VANCOCIN) 500 mg in  sodium chloride 0.9 % 100 mL IVPB     500 mg 100 mL/hr over 60 Minutes Intravenous Every 24 hours 04/18/14 1239     04/18/14 1400  piperacillin-tazobactam (ZOSYN) IVPB 2.25 g     2.25 g 100 mL/hr over 30 Minutes Intravenous 3 times per day 04/18/14 1231     04/18/14 0530  piperacillin-tazobactam (ZOSYN) IVPB 3.375 g  Status:  Discontinued     3.375 g 12.5 mL/hr over 240 Minutes Intravenous Every 8 hours 04/17/14 2313 04/18/14 1231   04/17/14 2330  piperacillin-tazobactam (ZOSYN) IVPB 3.375 g     3.375 g 100 mL/hr over 30 Minutes Intravenous  Once 04/17/14 2303 04/18/14 0020   04/17/14 2330  vancomycin (VANCOCIN) IVPB 1000 mg/200 mL premix     1,000 mg 200 mL/hr over 60 Minutes Intravenous  Once 04/17/14 2313 04/18/14 0052   04/17/14 2100  cefTRIAXone (ROCEPHIN) 2 g in dextrose 5 % 50 mL IVPB  Status:  Discontinued     2 g 100 mL/hr over 30 Minutes Intravenous  Once 04/17/14 2052 04/17/14 2053   04/17/14 2000  cefTRIAXone (ROCEPHIN) 1 g in dextrose 5 % 50 mL IVPB     1 g 100 mL/hr over 30 Minutes Intravenous  Once 04/17/14 1956 04/17/14 2159      Assessment: 80 [facility], now on day #2 of vancomycin and  Zosyn for r/o UTI vs HCAP vs abd wound source.   WBC elevated at 13, SCr improving from 3.03 to 2.32, CrCl ~ 74mL/min (borderline). Hypothermic and requiring pressors. Cultures are pending. UOP 0.8 cc/kg/hr.   Goal of Therapy:  Vancomycin trough level 15-20 mcg/ml  Plan:  Adjusted Vancomycin to  IV every 24 hours. Adjusted Zosyn to 2.25g IV every 8 hours.  Monitor renal function as borderline CrCl for extended interval dosing.  Monitor culture results and clinical status for ability to narrow therapy.   Link Snuffer, PharmD, BCPS Clinical Pharmacist [phone] 04/18/2014,3:29 PM

## 2014-04-18 NOTE — Progress Notes (Signed)
Critical NA and Chloride called to E-MD.  Orders received, will continue to monitor pt very closely.

## 2014-04-18 NOTE — Progress Notes (Signed)
Dr. Vassie LollAlva touched base with me earlier today and indicated that Renal does not need to follow patient at this time, and that CCM comfortable managing her electrolytes. Will therefore sign off.  Sierra Balynthia Milisa Kimbell, MD [facility] [phone] Pager 04/18/2014, 4:37 PM

## 2014-04-18 NOTE — ED Notes (Signed)
Spoke with Dr. Dellie CatholicSommers regarding pt's BP. See new orders

## 2014-04-18 NOTE — Progress Notes (Signed)
[facility] FOR AM LAB REPLACEMENT ONLY  The patient does not apply for the [facility] Electrolyte Replacment Protocol based on the criteria listed below:    Is GFR >/= 40 ml/min? No.  Patient's GFR today is 17    Abnormal electrolyte(s): K3.5  6. If a panic level lab has been reported, has the CCM MD in charge been notified? Yes.  .   Physician:  S Sommer,MD  Melrose NakayamaChisholm, Sevanna Ballengee William 04/18/2014 5:48 AM

## 2014-04-19 LAB — CBC WITH DIFFERENTIAL/PLATELET
Basophils Absolute: 0 10*3/uL (ref 0.0–0.1)
Basophils Relative: 0 % (ref 0–1)
EOS ABS: 0.2 10*3/uL (ref 0.0–0.7)
EOS PCT: 3 % (ref 0–5)
HCT: 26.6 % — ABNORMAL LOW (ref 36.0–46.0)
Hemoglobin: 7.3 g/dL — ABNORMAL LOW (ref 12.0–15.0)
LYMPHS PCT: 29 % (ref 12–46)
Lymphs Abs: 2.3 10*3/uL (ref 0.7–4.0)
MCH: 27.7 pg (ref 26.0–34.0)
MCHC: 27.4 g/dL — ABNORMAL LOW (ref 30.0–36.0)
MCV: 100.8 fL — ABNORMAL HIGH (ref 78.0–100.0)
Monocytes Absolute: 0.4 10*3/uL (ref 0.1–1.0)
Monocytes Relative: 5 % (ref 3–12)
Neutro Abs: 5 10*3/uL (ref 1.7–7.7)
Neutrophils Relative %: 63 % (ref 43–77)
Platelets: 47 10*3/uL — ABNORMAL LOW (ref 150–400)
RBC: 2.64 MIL/uL — ABNORMAL LOW (ref 3.87–5.11)
RDW: 18.9 % — ABNORMAL HIGH (ref 11.5–15.5)
WBC: 7.9 10*3/uL (ref 4.0–10.5)

## 2014-04-19 LAB — BASIC METABOLIC PANEL
BUN: 141 mg/dL — AB (ref 6–23)
BUN: 121 mg/dL — ABNORMAL HIGH (ref 6–23)
BUN: 114 mg/dL — ABNORMAL HIGH (ref 6–23)
CALCIUM: 9 mg/dL (ref 8.4–10.5)
CO2: 25 mmol/L (ref 19–32)
CO2: 19 mmol/L (ref 19–32)
CO2: 20 mmol/L (ref 19–32)
CREATININE: 1.8 mg/dL — AB (ref 0.50–1.10)
Calcium: 9.5 mg/dL (ref 8.4–10.5)
Calcium: 9.3 mg/dL (ref 8.4–10.5)
Chloride: >130 mmol/L (ref 96–112)
Chloride: >130 mmol/L (ref 96–112)
Creatinine, Ser: 1.65 mg/dL — ABNORMAL HIGH (ref 0.50–1.10)
Creatinine, Ser: 1.59 mg/dL — ABNORMAL HIGH (ref 0.50–1.10)
GFR calc Af Amer: 30 mL/min — ABNORMAL LOW (ref >90)
GFR calc Af Amer: 33 mL/min — ABNORMAL LOW (ref >90)
GFR calc Af Amer: 35 mL/min — ABNORMAL LOW (ref >90)
GFR calc non Af Amer: 26 mL/min — ABNORMAL LOW (ref >90)
GFR, EST NON AFRICAN AMERICAN: 29 mL/min — AB (ref >90)
GFR, EST NON AFRICAN AMERICAN: 30 mL/min — AB (ref >90)
Glucose, Bld: 89 mg/dL (ref 70–99)
Glucose, Bld: 85 mg/dL (ref 70–99)
Glucose, Bld: 88 mg/dL (ref 70–99)
POTASSIUM: 3.4 mmol/L — AB (ref 3.5–5.1)
POTASSIUM: 3.6 mmol/L (ref 3.5–5.1)
Potassium: 3.3 mmol/L — ABNORMAL LOW (ref 3.5–5.1)
SODIUM: 171 mmol/L — AB (ref 135–145)
Sodium: 169 mmol/L (ref 135–145)
Sodium: 167 mmol/L (ref 135–145)

## 2014-04-19 LAB — COMPREHENSIVE METABOLIC PANEL
ALBUMIN: 1.6 g/dL — AB (ref 3.5–5.2)
ALK PHOS: 63 U/L (ref 39–117)
ALT: 16 U/L (ref 0–35)
ALT: 17 U/L (ref 0–35)
AST: 26 U/L (ref 0–37)
AST: 26 U/L (ref 0–37)
Albumin: 1.5 g/dL — ABNORMAL LOW (ref 3.5–5.2)
Alkaline Phosphatase: 61 U/L (ref 39–117)
BILIRUBIN TOTAL: 0.7 mg/dL (ref 0.3–1.2)
BUN: 131 mg/dL — ABNORMAL HIGH (ref 6–23)
BUN: 156 mg/dL — AB (ref 6–23)
CALCIUM: 9.4 mg/dL (ref 8.4–10.5)
CALCIUM: 9.3 mg/dL (ref 8.4–10.5)
CO2: 24 mmol/L (ref 19–32)
CO2: 28 mmol/L (ref 19–32)
Chloride: >130 mmol/L (ref 96–112)
Creatinine, Ser: 1.75 mg/dL — ABNORMAL HIGH (ref 0.50–1.10)
Creatinine, Ser: 1.91 mg/dL — ABNORMAL HIGH (ref 0.50–1.10)
GFR calc Af Amer: 28 mL/min — ABNORMAL LOW (ref >90)
GFR calc Af Amer: 31 mL/min — ABNORMAL LOW (ref >90)
GFR calc non Af Amer: 24 mL/min — ABNORMAL LOW (ref >90)
GFR calc non Af Amer: 27 mL/min — ABNORMAL LOW (ref >90)
GLUCOSE: 108 mg/dL — AB (ref 70–99)
Glucose, Bld: 95 mg/dL (ref 70–99)
POTASSIUM: 3.5 mmol/L (ref 3.5–5.1)
Potassium: 3.4 mmol/L — ABNORMAL LOW (ref 3.5–5.1)
SODIUM: 171 mmol/L — AB (ref 135–145)
Sodium: 171 mmol/L (ref 135–145)
TOTAL PROTEIN: 6.7 g/dL (ref 6.0–8.3)
Total Bilirubin: 0.8 mg/dL (ref 0.3–1.2)
Total Protein: 7.1 g/dL (ref 6.0–8.3)

## 2014-04-19 LAB — GLUCOSE, CAPILLARY
GLUCOSE-CAPILLARY: 100 mg/dL — AB (ref 70–99)
GLUCOSE-CAPILLARY: 80 mg/dL (ref 70–99)
GLUCOSE-CAPILLARY: 90 mg/dL (ref 70–99)
Glucose-Capillary: 92 mg/dL (ref 70–99)
Glucose-Capillary: 59 mg/dL — ABNORMAL LOW (ref 70–99)
Glucose-Capillary: 80 mg/dL (ref 70–99)
Glucose-Capillary: 94 mg/dL (ref 70–99)

## 2014-04-19 LAB — PHOSPHORUS: Phosphorus: 3.9 mg/dL (ref 2.3–4.6)

## 2014-04-19 LAB — MAGNESIUM: Magnesium: 3.3 mg/dL — ABNORMAL HIGH (ref 1.5–2.5)

## 2014-04-19 LAB — HEMOGLOBIN A1C
HEMOGLOBIN A1C: 6.5 % — AB (ref 4.8–5.6)
MEAN PLASMA GLUCOSE: 140 mg/dL

## 2014-04-19 MED ORDER — VITAL HIGH PROTEIN PO LIQD
1000.0000 mL | ORAL | Status: DC
  Administered 2014-04-19: 1000 mL
  Filled 2014-04-19 (×2): qty 1000

## 2014-04-19 MED ORDER — POTASSIUM CHLORIDE 10 MEQ/50ML IV SOLN
10.0000 meq | INTRAVENOUS | Status: AC
  Administered 2014-04-19 (×2): 10 meq via INTRAVENOUS
  Filled 2014-04-19 (×2): qty 50

## 2014-04-19 MED ORDER — VITAL 1.5 CAL PO LIQD
1000.0000 mL | ORAL | Status: DC
  Administered 2014-04-19 – 2014-04-21 (×3): 1000 mL
  Filled 2014-04-19 (×7): qty 1000

## 2014-04-19 MED ORDER — VITAL HIGH PROTEIN PO LIQD: 1000.0000 mL | ORAL | Status: DC

## 2014-04-19 MED ORDER — PRO-STAT SUGAR FREE PO LIQD
30.0000 mL | Freq: Two times a day (BID) | ORAL | Status: DC
  Administered 2014-04-19 – 2014-04-22 (×7): 30 mL
  Filled 2014-04-19 (×9): qty 30

## 2014-04-19 NOTE — Progress Notes (Signed)
Critical lab values of Na 167, Cl >130; expected values. Passed along to MD. No changes to POC. Will continue to monitor.

## 2014-04-19 NOTE — Progress Notes (Signed)
PULMONARY / CRITICAL CARE MEDICINE   Name: Sierra Kerr MRN: [id number] DOB: Dec 20, 1934    ADMISSION DATE:  04/17/2014 CONSULTATION DATE:  04/17/2014  REFERRING MD :  Lynelle Doctor EDP  CHIEF COMPLAINT:  AMS  INITIAL PRESENTATION: 80 year old female with trach/chronic vent presented to [facility] ED 3/31 from [facility] with complaints of AMS, hyperkalemia, and hypernatremia.   STUDIES:  3/31 CT head> Atrophy without acute intracranial abnormality. 3/31 CT chest/a/p > b/l lower lobe airspace consolidation, spiculated 6mm nodule R lung apex. Edema suggesting UTI, Hypodense lesion R kidney.  SIGNIFICANT EVENTS: 1/13 . Admitted to [facility] for cholecystis with perforation, septic shock, intubated. To OR 2/5 Trach/PEG 2/23 > to [facility] 3/31 > AMS, to ED  HISTORY OF PRESENT ILLNESS:  80 year old female with PMH DM, and PAF. Was admitted to [facility] for septic shock  For perforated cholecystitis with surgical repair 01/29/2014. She was an mechanical ventilator for the entirety of her stay, which was complicated by AF RVR, C-diff, and refractory respiratory failure which ultimately resulted in trach/PEG 2/5. Also had upper extremity DVT from line, however unable to tolerate anticoagulation due to hemoptysis. She was admitted to [facility] 2/23. Since her admission there she has shown minimal functional improvement, however if sounds as though she has progressed to tolerate some capping trials with trach. Reportedly was recannulated 3/31 due to following, She had decreased level of responsiveness (baseline appears to be some movement of lower extremities and significant non-purposeful movement of upper arms, and grimacing to pain). Lab work was sent and noted profound hypernatremia, hyperkalemia, and hyperglycemia.  She was sent to ED for eval.     SUBJECTIVE: Remains unresponsive Afebrile Good UO   VITAL SIGNS: Temp:  [97.6 F (36.4 C)-99.2 F (37.3 C)] 98.9 F (37.2 C) (04/02 0811) Pulse Rate:  [71-85] 79 (04/02  0843) Resp:  [14-37] 24 (04/02 0843) BP: (84-143)/(28-115) 112/44 mmHg (04/02 0843) SpO2:  [99 %-100 %] 99 % (04/02 0843) FiO2 (%):  [28 %-35 %] 28 % (04/02 0843) Weight:  [74.3 kg (163 lb 12.8 oz)] 74.3 kg (163 lb 12.8 oz) (04/02 0327) HEMODYNAMICS:   VENTILATOR SETTINGS: Vent Mode:  [-]  FiO2 (%):  [28 %-35 %] 28 % INTAKE / OUTPUT:  Intake/Output Summary (Last 24 hours) at 04/19/14 1324 Last data filed at 04/19/14 0800  Gross per 24 hour  Intake 3577.35 ml  Output   2240 ml  Net 1337.35 ml    PHYSICAL EXAMINATION: General:  Female chronically ill appearing in NAD Neuro: opens eyes, grimace to pain, does not follow commands HEENT:  [place]/AT, no JVD, trach in place, PERRL Cardiovascular:   irreg irreg Lungs:  Diminished BL bases Abdomen:  Soft, non-distended, remote abd surgical wound. Musculoskeletal:  No acute deformity Skin:  Grossly intact  LABS:  CBC  Recent Labs Lab 04/17/14 2151 04/18/14 0243 04/19/14 0420  WBC 11.7* 13.0* 7.9  HGB 8.4* 7.8* 7.3*  HCT 32.4* 28.9* 26.6*  PLT 44* 43* 47*   Coag's  Recent Labs Lab 04/17/14 2151  APTT 38*  INR 1.51*   BMET  Recent Labs Lab 04/18/14 2030 04/18/14 2351 04/19/14 0420  NA 174* 171* 171*  K 3.4* 3.5 3.4*  CL >130* >130* >130*  CO2 BUN 161* 156* 141*  CREATININE 1.99* 1.91* 1.80*  GLUCOSE 106* 108* 89   Electrolytes  Recent Labs Lab 04/18/14 2030 04/18/14 2351 04/19/14 0420  CALCIUM 9.2 9.4 9.5   Sepsis Markers  Recent Labs Lab  04/17/14 2151 04/17/14 2209 04/18/14 0620  LATICACIDVEN 3.6* 3.33* 1.0  PROCALCITON 5.93  --   --    ABG  Recent Labs Lab 04/17/14 1834 04/17/14 2317  PHART 7.344* 7.322*  PCO2ART 45.7* 41.3  PO2ART 44.0* 70.0*   Liver Enzymes  Recent Labs Lab 04/17/14 2151 04/18/14 0244 04/18/14 2351  AST 27 24 26   ALT 17 16 16   ALKPHOS 78 76 63  BILITOT 0.3 0.5 0.7  ALBUMIN 1.9* 1.7* 1.6*   Cardiac Enzymes No results for input(s): TROPONINI,  PROBNP in the last 168 hours. Glucose  Recent Labs Lab 04/18/14 1628 04/18/14 1948 04/19/14 0037 04/19/14 0410 04/19/14 0504 04/19/14 0810  GLUCAP 99 94 92 59* 100* 80    Imaging [place] Renal  04/18/2014   CLINICAL DATA:  Acute renal failure.  EXAM: RENAL/URINARY TRACT ULTRASOUND COMPLETE  COMPARISON:  Noncontrast CT 4 hr prior.  FINDINGS: Right Kidney:  Length: 11.1 cm. There is thinning of the renal parenchyma and increased renal echogenicity. No hydronephrosis. Small hypoechoic structures consistent with cysts, largest in the lower pole measuring 2.2 x 2.7 x 2.5 cm corresponding to questioned abnormality on CT.  Left Kidney:  Length: 11.2 cm. Increased renal echogenicity and thinning of renal parenchyma. No mass or hydronephrosis visualized.  Bladder:  Decompressed by Foley catheter and not assessed.  IMPRESSION: 1. Echogenic kidneys with thinning of the renal parenchyma consistent with chronic medical renal disease. No obstructive uropathy. 2. Right renal cysts, largest in the lower pole measuring 2.7 cm. This corresponds to the indeterminate abnormality on CT.   Electronically Signed   By: Rubye OaksMelanie  Ehinger M.D.   On: 04/18/2014 02:07   Dg Chest Port 1 View  04/18/2014   CLINICAL DATA:  Code sepsis.  Pneumonia.  EXAM: PORTABLE CHEST - 1 VIEW  COMPARISON:  Chest CT 3 hr prior.  Chest radiographs 7 hr prior.  FINDINGS: Bibasilar consolidations, better appreciated on prior CT. Worsening of left lower lobe aeration from prior radiograph. Tracheostomy tube remains in place. Left internal jugular central venous catheter tip in the SVC. The heart size is normal. Minimal blunting of right costophrenic angle consistent with effusion. No pneumothorax.  IMPRESSION: Bibasilar consolidations, better appreciated on chest CT. Left lower lobe aeration has worsened from prior chest radiograph.   Electronically Signed   By: Rubye OaksMelanie  Ehinger M.D.   On: 04/18/2014 00:42     ASSESSMENT / PLAN:  PULMONARY Trach  2/5  [facility] > A: A on C hypoxemic respiratory failure Lung nodule -needs outpt FU in 6mnths depending on overall prognosis RLL PNA Trach status  P:   Continue ATC 35% titrate to SpO2 > 92%     CARDIOVASCULAR CVL [facility] pta > A:  Hypotension - suspect hypovolemia/sepsis Atrial fib - RVR QTc prolongation Elevated lactic acid  P:  Telemetry monitoring Hold outpatient antihypertensives Volume per renal section    RENAL A:   AKI R Renal lesion Hypernatremia (corrects to 187) - Hospitalist and nephrology conversation agree likely due to dehydration.  Hyperkalemia Renal US 4/1  -chronic medical renal disease, right renal cysts P:   Q 6 hour Bmet 1/2 NS 125/hr while on pressors then D5W Target Na drop 0.5-1/hr- about 12/ day    GASTROINTESTINAL A:   H/o cholecystis with perforation PEG  P:   Resume TF -titrate to goal SUP: IV protonix  HEMATOLOGIC A:   Anemia Thrombocytopenia  P:  Monitor CBC Transfuse per ICU guidlines  INFECTIOUS A:   Severe Sepsis, suspect  UTI source although abd wound, PNA, CVL also considered.   P:   BCx2 3/31 >GPC 1/2 >> UC 3/31 > Sputum 3/31 > Abx: Zosyn, start date 3/31> Abx: Vancomycin, start date 3/31> Follow WBC and fever curve  ENDOCRINE A:   Hyperglycemia with history of DM HHNK    P:   Insulin gtt off-  SSI CBG monitoring  NEUROLOGIC A:   Acute on chronic encephalopathy  P:   RASS goal: 0 Monitor   FAMILY  - Updates: none available since admit  - Inter-disciplinary family meet or Palliative Care meeting due by:  4/6  The patient is critically ill with multiple organ systems failure and requires high complexity decision making for assessment and support, frequent evaluation and titration of therapies, application of advanced monitoring technologies and extensive interpretation of multiple databases. Critical Care Time devoted to patient care services described in this note independent of APP time is 32  minutes.   Oretha Milch MD  04/19/2014 9:09 AM

## 2014-04-19 NOTE — Progress Notes (Signed)
eLink Physician-Brief Progress Note Patient Name: Sierra Kerr DOB: 08/08/1934 MRN: [id number]   Date of Service  04/19/2014  HPI/Events of Note  K+ = 3.4 and Creatinine = 1.8.   eICU Interventions  Will cautiously replete K+.     Intervention Category Intermediate Interventions: Electrolyte abnormality - evaluation and management  Sierra Kerr 04/19/2014, 6:31 AM

## 2014-04-19 NOTE — Progress Notes (Signed)
Critical hgb 8025f 7.3 and K of 3.4 reported to ccmd to be passed on to MD.

## 2014-04-19 NOTE — Progress Notes (Signed)
NUTRITION FOLLOW UP/CONSULT  INTERVENTION: Initiate TF via PEG with Vital 1.5 at 25 ml/h and Prostat 30 ml BID on day 1; on day 2, continue Prostat BID and increase to goal rate of 45 ml/h (1080 ml per day) to provide 1820 kcals, 103 gm protein, 821 ml free water daily.  RD to continue to monitor.  NUTRITION DIAGNOSIS: Inadequate oral intake related to inability to eat as evidenced by NPO status; ongoing  Goal: Pt to meet >/= 90% of their estimated nutrition needs; not met  Monitor:  TF initiation/tolerance, weight trends, labs, I/O's  80 y.o. female  Admitting Dx: <principal problem not specified>  ASSESSMENT: Pt with significant past medical history of IDDM, chronic resp failure s/p trach, atrial fibrillation, UE DVT, septic shock s/p perforated cholecystectomy presenting with sepsis, encephalopathy, hypernatremia, hyperkalemia, AKI, dehydration, UTI, hyperglycemia. Pt with trach collar and PEG. Pt with severe sepsis.  RD consulted for enteral/tube feeding initiation and management.   Labs: Low potassium and GFR. High sodium, chloride, BUN, and creatinine.  Height: Ht Readings from Last 1 Encounters:  04/18/14 _0  (1.676 m)    Weight: Wt Readings from Last 1 Encounters:  04/19/14 163 lb 12.8 oz (74.3 kg)  04/18/14  158 lbs (71.9 kg)  BMI:  Body mass index is 26.45 kg/(m^2).  Re-Estimated Nutritional Needs: Kcal: 1800-2050 Protein: 95-110 grams Fluid: 1.8 - 2 L/day  Skin: Incision on abdomen, non-pitting UE, +1 LE edema  Diet Order: Diet NPO time specified   Intake/Output Summary (Last 24 hours) at 04/19/14 1129 Last data filed at 04/19/14 1100  Gross per 24 hour  Intake 3438.45 ml  Output   2120 ml  Net 1318.45 ml    Last BM: 4/1  Labs:   Recent Labs Lab 04/18/14 2351 04/19/14 0420 04/19/14 0800  NA 171* 171* 171*  K 3.5 3.4* 3.4*  CL >130* >130* >130*  CO2 _1 BUN 156* 141* 131*  CREATININE 1.91* 1.80* 1.75*  CALCIUM 9.4 9.5 9.3   GLUCOSE 108* 89 95    CBG (last 3)   Recent Labs  04/19/14 0410 04/19/14 0504 04/19/14 0810  GLUCAP 59* 100* 80    Scheduled Meds: . antiseptic oral rinse  7 mL Mouth Rinse q12n4p  . chlorhexidine  15 mL Mouth Rinse BID  . feeding supplement (VITAL HIGH PROTEIN)  1,000 mL Per Tube Q24H  . free water  200 mL Per Tube Q6H  . insulin aspart  0-9 Units Subcutaneous 6 times per day  . piperacillin-tazobactam (ZOSYN)  IV  2.25 g Intravenous 3 times per day  . potassium chloride  10 mEq Intravenous Q1 Hr x 2  . sodium chloride  10-40 mL Intracatheter Q12H  . sodium chloride  3 mL Intravenous Q12H  . vancomycin  500 mg Intravenous Q24H    Continuous Infusions: . sodium chloride 125 mL/hr at 04/19/14 0826  . norepinephrine (LEVOPHED) Adult infusion 1.6 mcg/min (04/19/14 1000)    Past Medical History  Diagnosis Date  . Diabetes mellitus without complication   . Hypertension   . A-fib   . Respiratory failure, acute and chronic   . Sepsis 01/28/14    Perforated choly operated on at [facility]; septic post op.    Past Surgical History  Procedure Laterality Date  . Cholecystectomy    . Tracheostomy    . Peg tube placement      Kallie Locks, MS, RD, LDN Pager # [phone] After hours/ weekend pager # [phone]

## 2014-04-19 NOTE — Progress Notes (Signed)
Hypoglycemic Event  CBG: 59  Treatment:6225mls of D50.  Follow-up CBG: Time:0500 CBG Result100  Possible Reasons for Event: poor intake  Comments/MD notified:protocol initiated    Amo Kuffour, Alinda MoneyMelvin  Remember to initiate Hypoglycemia Order Set & complete

## 2014-04-20 DIAGNOSIS — R6520 Severe sepsis without septic shock: Secondary | ICD-10-CM

## 2014-04-20 DIAGNOSIS — N390 Urinary tract infection, site not specified: Secondary | ICD-10-CM

## 2014-04-20 LAB — GLUCOSE, CAPILLARY
GLUCOSE-CAPILLARY: 128 mg/dL — AB (ref 70–99)
GLUCOSE-CAPILLARY: 188 mg/dL — AB (ref 70–99)
Glucose-Capillary: 110 mg/dL — ABNORMAL HIGH (ref 70–99)
Glucose-Capillary: 124 mg/dL — ABNORMAL HIGH (ref 70–99)
Glucose-Capillary: 185 mg/dL — ABNORMAL HIGH (ref 70–99)
Glucose-Capillary: 195 mg/dL — ABNORMAL HIGH (ref 70–99)

## 2014-04-20 LAB — BASIC METABOLIC PANEL
BUN: 107 mg/dL — ABNORMAL HIGH (ref 6–23)
BUN: 69 mg/dL — ABNORMAL HIGH (ref 6–23)
BUN: 82 mg/dL — ABNORMAL HIGH (ref 6–23)
BUN: 95 mg/dL — AB (ref 6–23)
CALCIUM: 9.2 mg/dL (ref 8.4–10.5)
CALCIUM: 9 mg/dL (ref 8.4–10.5)
CALCIUM: 9.1 mg/dL (ref 8.4–10.5)
CO2: 14 mmol/L — ABNORMAL LOW (ref 19–32)
CO2: 19 mmol/L (ref 19–32)
CO2: 20 mmol/L (ref 19–32)
CO2: 21 mmol/L (ref 19–32)
CREATININE: 1.29 mg/dL — AB (ref 0.50–1.10)
Calcium: 8.6 mg/dL (ref 8.4–10.5)
Chloride: >130 mmol/L (ref 96–112)
Creatinine, Ser: 1.1 mg/dL (ref 0.50–1.10)
Creatinine, Ser: 1.39 mg/dL — ABNORMAL HIGH (ref 0.50–1.10)
Creatinine, Ser: 1.54 mg/dL — ABNORMAL HIGH (ref 0.50–1.10)
GFR calc Af Amer: 36 mL/min — ABNORMAL LOW (ref >90)
GFR calc Af Amer: 41 mL/min — ABNORMAL LOW (ref >90)
GFR calc non Af Amer: 31 mL/min — ABNORMAL LOW (ref >90)
GFR calc non Af Amer: 36 mL/min — ABNORMAL LOW (ref >90)
GFR calc non Af Amer: 39 mL/min — ABNORMAL LOW (ref >90)
GFR calc non Af Amer: 47 mL/min — ABNORMAL LOW (ref >90)
GFR, EST AFRICAN AMERICAN: 45 mL/min — AB (ref >90)
GFR, EST AFRICAN AMERICAN: 55 mL/min — AB (ref >90)
GLUCOSE: 135 mg/dL — AB (ref 70–99)
GLUCOSE: 188 mg/dL — AB (ref 70–99)
Glucose, Bld: 161 mg/dL — ABNORMAL HIGH (ref 70–99)
Glucose, Bld: 221 mg/dL — ABNORMAL HIGH (ref 70–99)
POTASSIUM: 3 mmol/L — AB (ref 3.5–5.1)
Potassium: 3.8 mmol/L (ref 3.5–5.1)
Potassium: 4 mmol/L (ref 3.5–5.1)
Potassium: 3.4 mmol/L — ABNORMAL LOW (ref 3.5–5.1)
SODIUM: 166 mmol/L — AB (ref 135–145)
Sodium: 160 mmol/L — ABNORMAL HIGH (ref 135–145)
Sodium: 168 mmol/L (ref 135–145)
Sodium: 169 mmol/L (ref 135–145)

## 2014-04-20 LAB — MAGNESIUM
MAGNESIUM: 2.7 mg/dL — AB (ref 1.5–2.5)
Magnesium: 2.9 mg/dL — ABNORMAL HIGH (ref 1.5–2.5)

## 2014-04-20 LAB — CBC WITH DIFFERENTIAL/PLATELET
BASOS ABS: 0 10*3/uL (ref 0.0–0.1)
BASOS PCT: 0 % (ref 0–1)
Eosinophils Absolute: 0.3 10*3/uL (ref 0.0–0.7)
Eosinophils Relative: 3 % (ref 0–5)
HCT: 26.1 % — ABNORMAL LOW (ref 36.0–46.0)
HEMOGLOBIN: 7.3 g/dL — AB (ref 12.0–15.0)
Lymphocytes Relative: 30 % (ref 12–46)
Lymphs Abs: 2.8 10*3/uL (ref 0.7–4.0)
MCH: 27.5 pg (ref 26.0–34.0)
MCHC: 28 g/dL — ABNORMAL LOW (ref 30.0–36.0)
MCV: 98.5 fL (ref 78.0–100.0)
MONO ABS: 0.5 10*3/uL (ref 0.1–1.0)
Monocytes Relative: 5 % (ref 3–12)
NEUTROS ABS: 5.7 10*3/uL (ref 1.7–7.7)
Neutrophils Relative %: 62 % (ref 43–77)
PLATELETS: 59 10*3/uL — AB (ref 150–400)
RBC: 2.65 MIL/uL — AB (ref 3.87–5.11)
RDW: 18.7 % — ABNORMAL HIGH (ref 11.5–15.5)
WBC: 9.3 10*3/uL (ref 4.0–10.5)

## 2014-04-20 LAB — URINE CULTURE

## 2014-04-20 LAB — PHOSPHORUS
Phosphorus: 3.1 mg/dL (ref 2.3–4.6)
Phosphorus: 3.6 mg/dL (ref 2.3–4.6)

## 2014-04-20 LAB — CULTURE, BLOOD (ROUTINE X 2)

## 2014-04-20 LAB — CLOSTRIDIUM DIFFICILE BY PCR: Toxigenic C. Difficile by PCR: NEGATIVE

## 2014-04-20 MED ORDER — POTASSIUM CHLORIDE 20 MEQ/15ML (10%) PO SOLN
40.0000 meq | ORAL | Status: AC
  Administered 2014-04-20 (×2): 40 meq
  Filled 2014-04-20 (×4): qty 30

## 2014-04-20 MED ORDER — DEXTROSE 5 % IV SOLN
INTRAVENOUS | Status: DC
  Administered 2014-04-20 – 2014-04-22 (×7): via INTRAVENOUS

## 2014-04-20 MED ORDER — PIPERACILLIN-TAZOBACTAM 3.375 G IVPB
3.3750 g | Freq: Three times a day (TID) | INTRAVENOUS | Status: DC
  Administered 2014-04-20 – 2014-04-21 (×3): 3.375 g via INTRAVENOUS
  Filled 2014-04-20 (×5): qty 50

## 2014-04-20 MED ORDER — SODIUM CHLORIDE 0.9 % IJ SOLN: 10.0000 mL | INTRAMUSCULAR | Status: DC | PRN

## 2014-04-20 MED ORDER — VANCOMYCIN HCL IN DEXTROSE 1-5 GM/200ML-% IV SOLN
1000.0000 mg | INTRAVENOUS | Status: DC
  Administered 2014-04-20: 1000 mg via INTRAVENOUS
  Filled 2014-04-20 (×2): qty 200

## 2014-04-20 NOTE — Progress Notes (Signed)
Critical values: sodium 169, chloride >130. Expected values. Elink aware. Will continue to monitor.

## 2014-04-20 NOTE — Progress Notes (Signed)
eLink Physician-Brief Progress Note Patient Name: Danise Edgelma Masten DOB: 10/30/1934 MRN: [id number]   Date of Service  04/20/2014  HPI/Events of Note    k low  eICU Interventions  Give k     Intervention Category Major Interventions: Other:  Lucus Lambertson 04/20/2014, 2:23 AM

## 2014-04-20 NOTE — Progress Notes (Signed)
Critical sodium reported to MD. Sodium noted to be 169.

## 2014-04-20 NOTE — Progress Notes (Signed)
l IJ 3 port cvc removed,per protocol.  No sutures visualized. VAseline guaze pressure dsg applied.  Pressure held, no bleedin noted after 1 minute.  Pressure vaseline guaze taped.

## 2014-04-20 NOTE — Progress Notes (Signed)
ANTIBIOTIC CONSULT NOTE - FOLLOW UP  Pharmacy Consult for Vanco/Zosyn Indication: r/o Sepsis  No Known Allergies  Patient Measurements: Height: 5\' 3"  (160 cm) Weight: 173 lb 14.4 oz (78.881 kg) IBW/kg (Calculated) : 52.4 Adjusted Body Weight:    Vital Signs: Temp: 98.1 F (36.7 C) (04/03 1441) Temp Source: Oral (04/03 1441) BP: 109/49 mmHg (04/03 1441) Pulse Rate: 80 (04/03 1148) Intake/Output from previous day: 04/02 0701 - 04/03 0700 In: 4159.9 [I.V.:2872.9; NG/GT:832; IV Piggyback:300] Out: 1395 [Urine:1395] Intake/Output from this shift: Total I/O In: 719 [I.V.:180; Other:60; NG/GT:479] Out: 315 [Urine:315]  Labs:  Recent Labs  04/18/14 0243  04/19/14 0420  04/19/14 1900 04/19/14 2355 04/20/14 0555  WBC 13.0*  --  7.9  --   --   --  9.3  HGB 7.8*  --  7.3*  --   --   --  7.3*  PLT 43*  --  47*  --   --   --  59*  CREATININE  --   < > 1.80*  < > 1.59* 1.54* 1.39*  < > = values in this interval not displayed. Estimated Creatinine Clearance: 33.7 mL/min (by C-G formula based on Cr of 1.39). No results for input(s): VANCOTROUGH, VANCOPEAK, VANCORANDOM, GENTTROUGH, GENTPEAK, GENTRANDOM, TOBRATROUGH, TOBRAPEAK, TOBRARND, AMIKACINPEAK, AMIKACINTROU, AMIKACIN in the last 72 hours.    Assessment: 80yo female from [facility] presents 3/31 with sepsis possibly 2/2 UTI vs PNA, hypernatremia, hyperkalemia. PMH: IDDM, chronic resp failure s/p trach, afib, UE DVT, hx GI perf & cholecystectomy   Anticoagulation: LMWH at [facility] - now none due to thrombocytopenia. Plts 59 (?baseline?)  Infectious Disease: r/o sepsis/PNA vs UTI vs abd wound source. Afebrile. WBC 9.3, LA 3.33 >>1, PCT 5.93  3/31 Vanc >> 3/31 Zosyn >> 3/31 CTX x 1  3/31 bld x 2 >>CNS x 1 3/31 UC >>100,000 Ecoli  Cardiovascular: Afib.nHypotensive due to hypovolemia/sepsis - 109/49, HR 80. No meds  Endocrinology: IDDM- SSI. CBGs 59-185 (IV d5W at 75)  Gastrointestinal / Nutrition: LFTs ok - hx.  Cholecystitis with perforation s/p repair 01/29/14. - PEG (02/21/14) - to resume TF. Albumin only 1.5.  Nephrology: R renal cysts. Elevated Na 169 (stable). SCr 1.39 down. K 3.8 replaced. UOP 0.8  Pulmonary: chronic Trach (02/21/14), FiO2 28%, 5L O2; copious secretions - PNA. Lung nodule - needs OP f/u  Hematology / Oncology: H/H 7.3, Plts 59  PTA Medication Issues: follow-up Best Practices: SCD, MC    Goal of Therapy:  Vancomycin trough level 15-20 mcg/ml  Plan:  Increase Vancomycin to 1g IV q24h Increase Zosyn to 3.375g IV q8hr.  Latifah Padin S. Merilynn Finlandobertson, PharmD, BCPS Clinical Staff Pharmacist Pager [phone]  Misty Stanleyobertson, Iman Reinertsen Stillinger 04/20/2014,2:52 PM

## 2014-04-20 NOTE — Progress Notes (Signed)
Critical lab values of Na 168, Cl >130; expected values. Potassium also down to 3.0. Passed along to eLink team. See orders. Will continue to monitor.

## 2014-04-20 NOTE — Progress Notes (Signed)
PULMONARY / CRITICAL CARE MEDICINE   Name: Sierra Kerr MRN: [id number] DOB: 1934/01/25    ADMISSION DATE:  04/17/2014 CONSULTATION DATE:  04/17/2014  REFERRING MD :  Lynelle Doctor EDP  CHIEF COMPLAINT:  AMS  INITIAL PRESENTATION: 80 year old female with trach/chronic vent presented to [facility] ED 3/31 from [facility] with complaints of AMS, hyperkalemia, and hypernatremia.   STUDIES:  3/31 CT head> Atrophy without acute intracranial abnormality. 3/31 CT chest/a/p > b/l lower lobe airspace consolidation, spiculated 6mm nodule R lung apex. Edema suggesting UTI, Hypodense lesion R kidney.  SIGNIFICANT EVENTS: 1/13 . Admitted to [facility] for cholecystis with perforation, septic shock, intubated. To OR 2/5 Trach/PEG 2/23 > to [facility] 3/31 > AMS, to ED  HISTORY OF PRESENT ILLNESS:  80 year old female with PMH DM, and PAF. Was admitted to [facility] for septic shock  For perforated cholecystitis with surgical repair 01/29/2014. She was an mechanical ventilator for the entirety of her stay, which was complicated by AF RVR, C-diff, and refractory respiratory failure which ultimately resulted in trach/PEG 2/5. Also had upper extremity DVT from line, however unable to tolerate anticoagulation due to hemoptysis. She was admitted to [facility] 2/23. Since her admission there she has shown minimal functional improvement, however if sounds as though she has progressed to tolerate some capping trials with trach. Reportedly was recannulated 3/31 due to following, She had decreased level of responsiveness (baseline appears to be some movement of lower extremities and significant non-purposeful movement of upper arms, and grimacing to pain). Lab work was sent and noted profound hypernatremia, hyperkalemia, and hyperglycemia.  She was sent to ED for eval.     SUBJECTIVE: More responsive Afebrile Good UO   VITAL SIGNS: Temp:  [97.8 F (36.6 C)-99.1 F (37.3 C)] 97.8 F (36.6 C) (04/03 0805) Pulse Rate:  [60-84] 65 (04/03  0845) Resp:  [13-33] 25 (04/03 0845) BP: (91-126)/(33-79) 105/61 mmHg (04/03 0845) SpO2:  [96 %-100 %] 99 % (04/03 0845) FiO2 (%):  [28 %] 28 % (04/03 0832) Weight:  [73.1 kg (161 lb 2.5 oz)] 73.1 kg (161 lb 2.5 oz) (04/03 0250) HEMODYNAMICS:   VENTILATOR SETTINGS: Vent Mode:  [-]  FiO2 (%):  [28 %] 28 % INTAKE / OUTPUT:  Intake/Output Summary (Last 24 hours) at 04/20/14 1000 Last data filed at 04/20/14 0955  Gross per 24 hour  Intake 4189.85 ml  Output   1400 ml  Net 2789.85 ml    PHYSICAL EXAMINATION: General:  Female chronically ill appearing in NAD Neuro: opens eyes, nods to name , does not follow commands HEENT:  [place]/AT, no JVD, trach in place, PERRL Cardiovascular:   irreg irreg Lungs:  Diminished BL bases Abdomen:  Soft, non-distended, remote abd surgical wound. Musculoskeletal:  No acute deformity Skin:  Grossly intact  LABS:  CBC  Recent Labs Lab 04/18/14 0243 04/19/14 0420 04/20/14 0555  WBC 13.0* 7.9 9.3  HGB 7.8* 7.3* 7.3*  HCT 28.9* 26.6* 26.1*  PLT 43* 47* 59*   Coag's  Recent Labs Lab 04/17/14 2151  APTT 38*  INR 1.51*   BMET  Recent Labs Lab 04/19/14 1900 04/19/14 2355 04/20/14 0555  NA 167* 168* 169*  K 3.3* 3.0* 3.8  CL >130* >130* >130*  CO2 BUN 114* 107* 95*  CREATININE 1.59* 1.54* 1.39*  GLUCOSE 88 161* 135*   Electrolytes  Recent Labs Lab 04/19/14 1244 04/19/14 1900 04/19/14 2355 04/20/14 0555  CALCIUM 9.3 9.0 9.0 9.1  MG 3.3*  --  2.9*  --   PHOS 3.9  --  3.6  --    Sepsis Markers  Recent Labs Lab 04/17/14 2151 04/17/14 2209 04/18/14 0620  LATICACIDVEN 3.6* 3.33* 1.0  PROCALCITON 5.93  --   --    ABG  Recent Labs Lab 04/17/14 1834 04/17/14 2317  PHART 7.344* 7.322*  PCO2ART 45.7* 41.3  PO2ART 44.0* 70.0*   Liver Enzymes  Recent Labs Lab 04/18/14 0244 04/18/14 2351 04/19/14 0800  AST 24 26 26   ALT 16 16 17   ALKPHOS 76 63 61  BILITOT 0.5 0.7 0.8  ALBUMIN 1.7* 1.6* 1.5*    Cardiac Enzymes No results for input(s): TROPONINI, PROBNP in the last 168 hours. Glucose  Recent Labs Lab 04/19/14 1159 04/19/14 1624 04/19/14 2024 04/19/14 2344 04/20/14 0358 04/20/14 0802  GLUCAP 94 80 90 124* 110* 128*    Imaging No results found.   ASSESSMENT / PLAN:  PULMONARY Trach 2/5  [facility] > A: A on C hypoxemic respiratory failure Lung nodule -needs outpt FU in 6mnths depending on overall prognosis RLL PNA Trach status  P:   Continue ATC 35% titrate to SpO2 > 92%     CARDIOVASCULAR CVL [facility] pta > A:  Hypotension - suspect hypovolemia/sepsis Atrial fib - RVR QTc prolongation Elevated lactic acid  P:  Telemetry monitoring Hold outpatient antihypertensives Volume per renal section    RENAL A:   AKI R Renal lesion Hypernatremia (corrects to 187) - Hospitalist and nephrology conversation agree likely due to dehydration.  Hyperkalemia Renal US 4/1  -chronic medical renal disease, right renal cysts P:   Q 8 hour Bmet  D5W now that off pressors  Target Na drop 0.5-1/hr- about 12/ day    GASTROINTESTINAL A:   H/o cholecystis with perforation PEG  P:   Resume TF -titrate to goal SUP:  protonix  HEMATOLOGIC A:   Anemia Thrombocytopenia  P:  Monitor CBC Transfuse per ICU guidlines  INFECTIOUS A:   Severe Sepsis, suspect UTI source although abd wound, PNA, CVL also considered.   P:   BCx2 3/31 >coag neg staph 1/2  UC 3/31 > e coli >> c diff pcr 4/3 >> neg Abx: Zosyn, start date 3/31> Abx: Vancomycin, start date 3/31> Follow WBC and fever curve  ENDOCRINE A:   Hyperglycemia with history of DM HHNK    P:   Insulin gtt off-  SSI CBG monitoring  NEUROLOGIC A:   Acute on chronic encephalopathy  P:   RASS goal: 0 Monitor   FAMILY  - Updates: none available since admit  - Inter-disciplinary family meet or Palliative Care meeting due by:  4/6  The patient is critically ill with multiple organ systems failure and  requires high complexity decision making for assessment and support, frequent evaluation and titration of therapies, application of advanced monitoring technologies and extensive interpretation of multiple databases. Critical Care Time devoted to patient care services described in this note independent of APP time is 32 minutes.   Oretha MilchALVA,RAKESH V. MD  04/20/2014 10:00 AM

## 2014-04-20 NOTE — Progress Notes (Signed)
NURSING PROGRESS NOTE  Danise Edgelma Micek [id number] Transfer Data: 04/20/2014 4:18 PM Attending Provider: Oretha Milchakesh Alva V, MD WUJ:[id number]:[place], [facility], MD Code Status: FUll  Danise Edgelma Ellen is a 80 y.o. female patient [place]  [place]. Is  Non-verbal Cardiac Monitoring: Box inplace.  Blood pressure 109/49, pulse 80, temperature 98.1 F (36.7 C), temperature source Oral, resp. rate 20, height 5\' 3"  (1.6 m), weight 78.881 kg (173 lb 14.4 oz), SpO2 99 %.   Allergies:  Review of patient's allergies indicates no known allergies.  Past Medical History:   has a past medical history of Diabetes mellitus without complication; Hypertension; A-fib; Respiratory failure, acute and chronic; and Sepsis (01/28/14).  Past Surgical History:   has past surgical history that includes Cholecystectomy; Tracheostomy; and PEG tube placement.  Skin:Intact   Admission inpatient armband information verified with patient/family to include name and date of birth and placed on patient arm. Side rails up x 2, fall assessment and education completed with patient/family. Call light within reach.   Will continue to evaluate and treat per MD orders.

## 2014-04-20 NOTE — Progress Notes (Signed)
CRITICAL VALUE ALERT  Critical value received:  Chloride >130  Date of notification:  04/20/14   Time of notification:  2346  Critical value read back:Yes.    Nurse who received alert:  Judee Clararimaine Pernie Grosso  MD notified (1st page):  Molli KnockYacoub, MD  Time of first page:  2349  MD notified (2nd page):  Time of second page:  Responding MD:  Molli KnockYacoub, MD  Time MD responded:  [phone]

## 2014-04-20 NOTE — Progress Notes (Signed)
Peripherally Inserted Central Catheter/Midline Placement  The IV Nurse has discussed with the patient and/or persons authorized to consent for the patient, the purpose of this procedure and the potential benefits and risks involved with this procedure.  The benefits include less needle sticks, lab draws from the catheter and patient may be discharged home with the catheter.  Risks include, but not limited to, infection, bleeding, blood clot (thrombus formation), and puncture of an artery; nerve damage and irregular heat beat.  Alternatives to this procedure were also discussed. Consent obtained from husband via telephone.  PICC/Midline Placement Documentation  PICC / Midline Single Lumen 04/20/14 PICC Right Brachial 41 cm 0 cm (Active)  Indication for Insertion or Continuance of Line Limited venous access - need for IV therapy >5 days (PICC only);Prolonged intravenous therapies;Home intravenous therapies (PICC only) 04/20/2014  4:15 PM  Exposed Catheter (cm) 0 cm 04/20/2014  4:15 PM  Site Assessment Clean;Dry;Intact 04/20/2014  4:15 PM  Line Status Flushed;Saline locked;Blood return noted 04/20/2014  4:15 PM  Dressing Type Transparent 04/20/2014  4:15 PM  Dressing Status Clean;Dry;Intact;Antimicrobial disc in place 04/20/2014  4:15 PM  Line Care Connections checked and tightened 04/20/2014  4:15 PM  Line Adjustment (NICU/IV Team Only) No 04/20/2014  4:15 PM  Dressing Intervention New dressing 04/20/2014  4:15 PM  Dressing Change Due 04/27/14 04/20/2014  4:15 PM       Elliot Dallyiggs, Dezaria Methot Wright 04/20/2014, 4:16 PM

## 2014-04-21 LAB — CBC WITH DIFFERENTIAL/PLATELET
BASOS PCT: 0 % (ref 0–1)
Basophils Absolute: 0 10*3/uL (ref 0.0–0.1)
Eosinophils Absolute: 0.4 10*3/uL (ref 0.0–0.7)
Eosinophils Relative: 6 % — ABNORMAL HIGH (ref 0–5)
HEMATOCRIT: 25 % — AB (ref 36.0–46.0)
HEMOGLOBIN: 7 g/dL — AB (ref 12.0–15.0)
Lymphocytes Relative: 25 % (ref 12–46)
Lymphs Abs: 1.8 10*3/uL (ref 0.7–4.0)
MCH: 27.6 pg (ref 26.0–34.0)
MCHC: 28 g/dL — ABNORMAL LOW (ref 30.0–36.0)
MCV: 98.4 fL (ref 78.0–100.0)
MONOS PCT: 5 % (ref 3–12)
Monocytes Absolute: 0.3 10*3/uL (ref 0.1–1.0)
NEUTROS ABS: 4.7 10*3/uL (ref 1.7–7.7)
Neutrophils Relative %: 65 % (ref 43–77)
Platelets: 67 10*3/uL — ABNORMAL LOW (ref 150–400)
RBC: 2.54 MIL/uL — ABNORMAL LOW (ref 3.87–5.11)
RDW: 18.2 % — ABNORMAL HIGH (ref 11.5–15.5)
WBC: 7.3 10*3/uL (ref 4.0–10.5)

## 2014-04-21 LAB — GLUCOSE, CAPILLARY
GLUCOSE-CAPILLARY: 158 mg/dL — AB (ref 70–99)
GLUCOSE-CAPILLARY: 155 mg/dL — AB (ref 70–99)
Glucose-Capillary: 152 mg/dL — ABNORMAL HIGH (ref 70–99)
Glucose-Capillary: 206 mg/dL — ABNORMAL HIGH (ref 70–99)
Glucose-Capillary: 222 mg/dL — ABNORMAL HIGH (ref 70–99)
Glucose-Capillary: 238 mg/dL — ABNORMAL HIGH (ref 70–99)

## 2014-04-21 LAB — BASIC METABOLIC PANEL
BUN: 65 mg/dL — AB (ref 6–23)
BUN: 54 mg/dL — AB (ref 6–23)
CALCIUM: 9.1 mg/dL (ref 8.4–10.5)
CALCIUM: 8.7 mg/dL (ref 8.4–10.5)
CO2: 22 mmol/L (ref 19–32)
CO2: 20 mmol/L (ref 19–32)
Chloride: >130 mmol/L (ref 96–112)
Creatinine, Ser: 1.07 mg/dL (ref 0.50–1.10)
Creatinine, Ser: 0.94 mg/dL (ref 0.50–1.10)
GFR calc Af Amer: 57 mL/min — ABNORMAL LOW (ref >90)
GFR calc Af Amer: 66 mL/min — ABNORMAL LOW (ref >90)
GFR calc non Af Amer: 49 mL/min — ABNORMAL LOW (ref >90)
GFR, EST NON AFRICAN AMERICAN: 57 mL/min — AB (ref >90)
GLUCOSE: 180 mg/dL — AB (ref 70–99)
Glucose, Bld: 228 mg/dL — ABNORMAL HIGH (ref 70–99)
POTASSIUM: 3.4 mmol/L — AB (ref 3.5–5.1)
Potassium: 3.8 mmol/L (ref 3.5–5.1)
SODIUM: 158 mmol/L — AB (ref 135–145)
Sodium: 165 mmol/L (ref 135–145)

## 2014-04-21 LAB — PHOSPHORUS
Phosphorus: 2.6 mg/dL (ref 2.3–4.6)
Phosphorus: 2.3 mg/dL (ref 2.3–4.6)

## 2014-04-21 LAB — MAGNESIUM
MAGNESIUM: 2.4 mg/dL (ref 1.5–2.5)
MAGNESIUM: 2.3 mg/dL (ref 1.5–2.5)

## 2014-04-21 MED ORDER — INSULIN ASPART 100 UNIT/ML ~~LOC~~ SOLN
0.0000 [IU] | SUBCUTANEOUS | Status: DC
  Administered 2014-04-21 (×3): 5 [IU] via SUBCUTANEOUS
  Administered 2014-04-22: 3 [IU] via SUBCUTANEOUS
  Administered 2014-04-22: 5 [IU] via SUBCUTANEOUS
  Administered 2014-04-22 (×2): 3 [IU] via SUBCUTANEOUS
  Administered 2014-04-22: 2 [IU] via SUBCUTANEOUS

## 2014-04-21 MED ORDER — CEFTRIAXONE SODIUM IN DEXTROSE 20 MG/ML IV SOLN
1.0000 g | INTRAVENOUS | Status: DC
  Administered 2014-04-21 – 2014-04-22 (×2): 1 g via INTRAVENOUS
  Filled 2014-04-21 (×2): qty 50

## 2014-04-21 NOTE — Progress Notes (Signed)
MD and NP both paged in attempt to relay critical chloride value times two. Waiting for response, will continue to monitor.

## 2014-04-21 NOTE — Progress Notes (Signed)
CRITICAL VALUE ALERT  Critical value received:  Na 165, Cl >130  Date of notification:  04/21/14  Time of notification:  0514  Critical value read back:Yes.    Nurse who received alert:  Waverley Krempasky  MD notified (1st page):  Molli KnockYacoub, md   Time of first page:  [phone]  MD notified (2nd page):  Time of second page:  Responding MD:  Oswaldo ConroyJacoub, MD  Time MD responded:  [phone]

## 2014-04-21 NOTE — Progress Notes (Signed)
ANTIBIOTIC CONSULT NOTE - INITIAL  Pharmacy Consult for Rocephin Indication: EColi UTI  No Known Allergies  Patient Measurements: Height: 5\' 3"  (160 cm) Weight: 173 lb 11.6 oz (78.8 kg) IBW/kg (Calculated) : 52.4 Adjusted Body Weight:   Vital Signs: Temp: 98.5 F (36.9 C) (04/04 0555) BP: 132/93 mmHg (04/04 0555) Pulse Rate: 72 (04/04 0851) Intake/Output from previous day: 04/03 0701 - 04/04 0700 In: 2711.1 [I.V.:1822.1; NG/GT:479; IV Piggyback:350] Out: 705 [Urine:705] Intake/Output from this shift:    Labs:  Recent Labs  04/19/14 0420  04/20/14 0555 04/20/14 1450 04/20/14 2250 04/21/14 0355  WBC 7.9  --  9.3  --   --  7.3  HGB 7.3*  --  7.3*  --   --  7.0*  PLT 47*  --  59*  --   --  67*  CREATININE 1.80*  < > 1.39* 1.29* 1.10 1.07  < > = values in this interval not displayed. Estimated Creatinine Clearance: 43.8 mL/min (by C-G formula based on Cr of 1.07). No results for input(s): VANCOTROUGH, VANCOPEAK, VANCORANDOM, GENTTROUGH, GENTPEAK, GENTRANDOM, TOBRATROUGH, TOBRAPEAK, TOBRARND, AMIKACINPEAK, AMIKACINTROU, AMIKACIN in the last 72 hours.   Microbiology: Recent Results (from the past 720 hour(s))  Blood Culture (routine x 2)     Status: None   Collection Time: 04/17/14  6:02 PM  Result Value Ref Range Status   Specimen Description BLOOD HAND LEFT  Final   Special Requests BOTTLES DRAWN AEROBIC AND ANAEROBIC 10CC  Final   Culture   Final    STAPHYLOCOCCUS SPECIES (COAGULASE NEGATIVE) Note: THE SIGNIFICANCE OF ISOLATING THIS ORGANISM FROM A SINGLE SET OF BLOOD CULTURES WHEN MULTIPLE SETS ARE DRAWN IS UNCERTAIN. PLEASE NOTIFY THE MICROBIOLOGY DEPARTMENT WITHIN ONE WEEK IF SPECIATION AND SENSITIVITIES ARE REQUIRED. Note: Gram Stain Report Called to,Read Back By and Verified With: Marcell AngerMELIND M [id number] BJENN Performed at [facility]    Report Status 04/20/2014 FINAL  Final  Blood Culture (routine x 2)     Status: None (Preliminary result)    Collection Time: 04/17/14  6:14 PM  Result Value Ref Range Status   Specimen Description BLOOD HAND RIGHT  Final   Special Requests BOTTLES DRAWN AEROBIC AND ANAEROBIC 5CC  Final   Culture   Final           BLOOD CULTURE RECEIVED NO GROWTH TO DATE CULTURE WILL BE HELD FOR 5 DAYS BEFORE ISSUING A FINAL NEGATIVE REPORT Performed at [facility]    Report Status PENDING  Incomplete  Urine culture     Status: None   Collection Time: 04/17/14  6:45 PM  Result Value Ref Range Status   Specimen Description URINE, CATHETERIZED  Final   Special Requests NONE  Final   Colony Count   Final    >=100,000 COLONIES/ML Performed at [facility]    Culture   Final    ESCHERICHIA COLI Performed at [facility]    Report Status 04/20/2014 FINAL  Final   Organism ID, Bacteria ESCHERICHIA COLI  Final      Susceptibility   Escherichia coli - MIC*    AMPICILLIN 4 SENSITIVE Sensitive     CEFAZOLIN <=4 SENSITIVE Sensitive     CEFTRIAXONE <=1 SENSITIVE Sensitive     CIPROFLOXACIN >=4 RESISTANT Resistant     GENTAMICIN <=1 SENSITIVE Sensitive     LEVOFLOXACIN >=8 RESISTANT Resistant     NITROFURANTOIN <=16 SENSITIVE Sensitive     TOBRAMYCIN <=1 SENSITIVE Sensitive     TRIMETH/SULFA <=  20 SENSITIVE Sensitive     PIP/TAZO <=4 SENSITIVE Sensitive     * ESCHERICHIA COLI  MRSA PCR Screening     Status: None   Collection Time: 04/18/14  2:55 AM  Result Value Ref Range Status   MRSA by PCR NEGATIVE NEGATIVE Final    Comment:        The GeneXpert MRSA Assay (FDA approved for NASAL specimens only), is one component of a comprehensive MRSA colonization surveillance program. It is not intended to diagnose MRSA infection nor to guide or monitor treatment for MRSA infections.   Clostridium Difficile by PCR     Status: None   Collection Time: 04/20/14  5:35 AM  Result Value Ref Range Status   C difficile by pcr NEGATIVE NEGATIVE Final    Medical History: Past Medical History   Diagnosis Date  . Diabetes mellitus without complication   . Hypertension   . A-fib   . Respiratory failure, acute and chronic   . Sepsis 01/28/14    Perforated choly operated on at [facility]; septic post op.    Medications:  Scheduled:  . antiseptic oral rinse  7 mL Mouth Rinse q12n4p  . cefTRIAXone (ROCEPHIN)  IV  1 g Intravenous Q24H  . chlorhexidine  15 mL Mouth Rinse BID  . feeding supplement (PRO-STAT SUGAR FREE 64)  30 mL Per Tube BID  . free water  200 mL Per Tube Q6H  . insulin aspart  0-15 Units Subcutaneous 6 times per day  . sodium chloride  3 mL Intravenous Q12H   Assessment: 80yo female who has been receiving Vancomycin and Zosyn, now to change to Rocephin for [facility] UTI which is sensitive to this.  Pt is AFebrile and WBC wnl.  Cr corrected to 1.07 this AM  Goal of Therapy:  resolution of infection  Plan:  Rocephin 1g IV q24 Pharmacy will sign off as this will not require renal adjustment if changes in renal fxn occur.  Please reconsult as needed.  Marisue Humble, PharmD Clinical Pharmacist [facility]

## 2014-04-21 NOTE — Clinical Social Work Psychosocial (Signed)
Clinical Social Work Department BRIEF PSYCHOSOCIAL ASSESSMENT 04/21/2014  Patient:  Sierra Kerr,Sierra Kerr     Account Number:  [id number]     Admit date:  04/17/2014  Clinical Social Worker:  Derenda FennelNIXON,Joyceann Kruser, CLINICAL SOCIAL WORKER  Date/Time:  04/21/2014 10:09 AM  Referred by:  Physician  Date Referred:  04/21/2014 Referred for  SNF Placement   Other Referral:   Interview type:  Other - See comment Other interview type:   CSW spoke with pt's husband Sierra Kerr via telephone.    PSYCHOSOCIAL DATA Living Status:  FACILITY Admitted from facility[facility]  Level of care:  Skilled Nursing Facility Primary support name:  Linda Hedgesndrew Stcyr Primary support relationship to patient:  SPOUSE Degree of support available:   Strong    CURRENT CONCERNS Current Concerns  Post-Acute Placement   Other Concerns:    SOCIAL WORK ASSESSMENT / PLAN Clinical Social Worker spoke with patient's husband, Sierra Kerr in reference to post-acute placement/ [facility]'s [facility] (SNF). Pt's husband reported that [facility] for about 2 months now and that he is pleased with the care that she is receiving. Pt's husband further stated he is agreeable to pt returning to [facility] SNF once medically stable. Pt's husband did not reported any other concerns. CSW [facility] to confirm pt's return once off of pressors. CSW will continue to follow pt and pt's family for continued support and to facilitate pt's discharge needs once medically stable.   Assessment/plan status:  Psychosocial Support/Ongoing Assessment of Needs Other assessment/ plan:   FL-2 completed.   Information/referral to community resources:   SNF.    PATIENT'S/FAMILY'S RESPONSE TO PLAN OF CARE: Pt unresponsive. Pt's husband pleasant and agreeable to pt's return [facility].    Derenda FennelBashira Tailyn Hantz, MSW, LCSWA [phone] 04/21/2014 10:33 AM

## 2014-04-21 NOTE — Progress Notes (Signed)
PULMONARY / CRITICAL CARE MEDICINE   Name: Sierra Kerr MRN: [id number] DOB: 03/03/1934    ADMISSION DATE:  04/17/2014 CONSULTATION DATE:  04/17/2014  REFERRING MD :  Lynelle DoctorKNAPP EDP  CHIEF COMPLAINT:  AMS  INITIAL PRESENTATION: 80 year old female with trach/chronic vent [facility] from [facility] with complaints of AMS, hyperkalemia, and hypernatremia.   STUDIES:  3/31 CT head> Atrophy without acute intracranial abnormality. 3/31 CT chest/a/p > b/l lower lobe airspace consolidation, spiculated 6mm nodule R lung apex. Edema suggesting UTI, Hypodense lesion R kidney.  SIGNIFICANT EVENTS: 1/13 . [facility]olecyst[facility] with perforation, septic shock, intubated. To OR 2/5 Trach/PEG 2/23 > to [facility] 3/31 > AMS, to ED 4/3 Moved to med/surg  HISTORY OF PRESENT ILLNESS:  80 year old female with PMH DM, and PAF. [facility]  For perforated cholecystitis with surgical repair 01/29/2014. She was an mechanical ventilator for the entirety of her stay, which was complicated by AF RVR, C-diff, and refractory respiratory failure which ultimately resulted in trach/PEG 2/5. Also had upper extremity DVT from line, however unable to tolerate anticoagulation due to hemoptysis. [facility]. Since her admission there she has shown minimal functional improvement, however if sounds as though she has progressed to tolerate some capping trials with trach. Reportedly was recannulated 3/31 due to following, She had decreased level of responsiveness (baseline appears to be some movement of lower extremities and significant non-purposeful movement of upper arms, and grimacing to pain). Lab work was sent and noted profound hypernatremia, hyperkalemia, and hyperglycemia.  She was sent to ED for eval.     SUBJECTIVE: Still slow to respond.  VITAL SIGNS: Temp:  [97.6 F (36.4 C)-98.5 F (36.9 C)] 98.5 F (36.9 C) (04/04 0555) Pulse Rate:  [70-85] 72 (04/04  0851) Resp:  [16-20] 16 (04/04 0851) BP: (109-132)/(45-93) 132/93 mmHg (04/04 0555) SpO2:  [94 %-100 %] 97 % (04/04 0851) FiO2 (%):  [28 %-40 %] 40 % (04/04 0851) Weight:  [78.8 kg (173 lb 11.6 oz)-79 kg (174 lb 2.6 oz)] 78.8 kg (173 lb 11.6 oz) (04/04 0555)    VENTILATOR SETTINGS: Vent Mode:  [-]  FiO2 (%):  [28 %-40 %] 40 % INTAKE / OUTPUT:  Intake/Output Summary (Last 24 hours) at 04/21/14 0910 Last data filed at 04/21/14 0555  Gross per 24 hour  Intake 2537.08 ml  Output    640 ml  Net 1897.08 ml    PHYSICAL EXAMINATION: General:  Female chronically ill appearing in NAD Neuro: opens eyes, localizes, does not follow commands HEENT:  /AT, no JVD, trach in place, PERRL Cardiovascular:   irreg irreg Lungs:  Diminished BL bases Abdomen:  Soft, non-distended, remote abd surgical wound. Musculoskeletal:  No acute deformity Skin:  Grossly intact  LABS:  CBC  Recent Labs Lab 04/19/14 0420 04/20/14 0555 04/21/14 0355  WBC 7.9 9.3 7.3  HGB 7.3* 7.3* 7.0*  HCT 26.6* 26.1* 25.0*  PLT 47* 59* 67*    BMET  Recent Labs Lab 04/20/14 1450 04/20/14 2250 04/21/14 0355  NA 166* 160* 165*  K 4.0 3.4* 3.8  CL >130* >130* >130*  CO2 19 14* 22  BUN 82* 69* 65*  CREATININE 1.29* 1.10 1.07  GLUCOSE 221* 188* 180*    Glucose  Recent Labs Lab 04/20/14 1207 04/20/14 1652 04/20/14 2011 04/21/14 0108 04/21/14 0415 04/21/14 0734  GLUCAP 185* 195* 188* 158* 152* 155*    Imaging No results  found.   ASSESSMENT / PLAN:  Acute on chronic encephalopathy in setting of sepsis and severe metabolic derangements Still slow to respond  Plan:   RASS goal: 0 Monitor  Severe dehydration from HHNK w/ severe hypernatremia  AKI->improved.  R Renal lesion Renal US 4/1  -chronic medical renal disease, right renal cysts Plan:   Adjust D5W (increased to 175 cc/hr) Q 8 hour Bmet Target Na drop 0.5-1/hr- about 12/ day  E coli UTI Possible PNA Possible wound  infection Coag neg SA in blood representing contamination BCx2 3/31 >coag neg staph 1/2  UC 3/31 > e coli (quinolone resistant) c diff pcr 4/3 >> neg Abx: Vancomycin, start date 3/31>4/4 Abx: Zosyn, start date 3/31>4/4 PLAN: Change ABX: rocephin 4/4>> Follow WBC and fever curve  A on C hypoxemic respiratory failure Lung nodule  RLL PNA Trach status (2/5) PLAN:   Continue ATC 35% titrate to SpO2 > 92% needs outpt FU in depending on overall prognosis   H/o cholecystis with perforation PEG PLAN:   Cont tubefeeds-titrate to goal SUP:  protonix  Anemia Thrombocytopenia PLAN:  Monitor CBC   Hyperglycemia with history of DM HHNK  >off insulin gtt since 4/3 PLAN:   SSI CBG monitoring    FAMILY  - Updates: none available since admit  - Inter-disciplinary family meet or Palliative Care meeting due by:  4/6   Simonne Martinet ACNP-BC [facility] Pulmonary/Critical Care Pager # [phone] OR # [phone] if no answer  04/21/2014 9:10 AM

## 2014-04-22 LAB — BASIC METABOLIC PANEL
ANION GAP: 1 — AB (ref 5–15)
ANION GAP: 4 — AB (ref 5–15)
BUN: 43 mg/dL — ABNORMAL HIGH (ref 6–23)
BUN: 36 mg/dL — AB (ref 6–23)
CHLORIDE: 127 mmol/L — AB (ref 96–112)
CO2: 21 mmol/L (ref 19–32)
CO2: 21 mmol/L (ref 19–32)
CREATININE: 0.86 mg/dL (ref 0.50–1.10)
CREATININE: 0.73 mg/dL (ref 0.50–1.10)
Calcium: 8.6 mg/dL (ref 8.4–10.5)
Calcium: 8.9 mg/dL (ref 8.4–10.5)
Chloride: 128 mmol/L — ABNORMAL HIGH (ref 96–112)
GFR calc Af Amer: 74 mL/min — ABNORMAL LOW (ref >90)
GFR calc non Af Amer: 64 mL/min — ABNORMAL LOW (ref >90)
GFR, EST NON AFRICAN AMERICAN: 80 mL/min — AB (ref >90)
Glucose, Bld: 255 mg/dL — ABNORMAL HIGH (ref 70–99)
Glucose, Bld: 171 mg/dL — ABNORMAL HIGH (ref 70–99)
Potassium: 3.5 mmol/L (ref 3.5–5.1)
Potassium: 3.6 mmol/L (ref 3.5–5.1)
Sodium: 150 mmol/L — ABNORMAL HIGH (ref 135–145)
Sodium: 152 mmol/L — ABNORMAL HIGH (ref 135–145)

## 2014-04-22 LAB — CBC WITH DIFFERENTIAL/PLATELET
Basophils Absolute: 0 10*3/uL (ref 0.0–0.1)
Basophils Relative: 0 % (ref 0–1)
EOS PCT: 5 % (ref 0–5)
Eosinophils Absolute: 0.4 10*3/uL (ref 0.0–0.7)
HCT: 25.8 % — ABNORMAL LOW (ref 36.0–46.0)
Hemoglobin: 7.4 g/dL — ABNORMAL LOW (ref 12.0–15.0)
Lymphocytes Relative: 30 % (ref 12–46)
Lymphs Abs: 2.3 10*3/uL (ref 0.7–4.0)
MCH: 27.8 pg (ref 26.0–34.0)
MCHC: 28.7 g/dL — AB (ref 30.0–36.0)
MCV: 97 fL (ref 78.0–100.0)
MONO ABS: 0.5 10*3/uL (ref 0.1–1.0)
Monocytes Relative: 6 % (ref 3–12)
NEUTROS ABS: 4.5 10*3/uL (ref 1.7–7.7)
Neutrophils Relative %: 59 % (ref 43–77)
PLATELETS: 78 10*3/uL — AB (ref 150–400)
RBC: 2.66 MIL/uL — ABNORMAL LOW (ref 3.87–5.11)
RDW: 17.3 % — ABNORMAL HIGH (ref 11.5–15.5)
WBC: 7.5 10*3/uL (ref 4.0–10.5)

## 2014-04-22 LAB — GLUCOSE, CAPILLARY
GLUCOSE-CAPILLARY: 158 mg/dL — AB (ref 70–99)
GLUCOSE-CAPILLARY: 171 mg/dL — AB (ref 70–99)
GLUCOSE-CAPILLARY: 228 mg/dL — AB (ref 70–99)
Glucose-Capillary: 167 mg/dL — ABNORMAL HIGH (ref 70–99)
Glucose-Capillary: 130 mg/dL — ABNORMAL HIGH (ref 70–99)

## 2014-04-22 MED ORDER — METOPROLOL TARTRATE 25 MG/10 ML ORAL SUSPENSION
12.5000 mg | Freq: Two times a day (BID) | ORAL | Status: DC
  Administered 2014-04-22: 12.5 mg
  Filled 2014-04-22 (×2): qty 5

## 2014-04-22 MED ORDER — DEXTROSE 5 % IV SOLN: INTRAVENOUS | Status: AC

## 2014-04-22 MED ORDER — FREE WATER: 300.0000 mL | Freq: Four times a day (QID) | Status: AC

## 2014-04-22 MED ORDER — INSULIN ASPART 100 UNIT/ML ~~LOC~~ SOLN: 0.0000 [IU] | SUBCUTANEOUS | Status: AC

## 2014-04-22 MED ORDER — PRO-STAT SUGAR FREE PO LIQD: 30.0000 mL | Freq: Two times a day (BID) | ORAL | Status: AC

## 2014-04-22 MED ORDER — FREE WATER
300.0000 mL | Freq: Four times a day (QID) | Status: DC
  Administered 2014-04-22: 300 mL

## 2014-04-22 MED ORDER — DEXTROSE 50 % IV SOLN
25.0000 mL | INTRAVENOUS | Status: AC | PRN
Start: 2014-04-22 — End: ?

## 2014-04-22 MED ORDER — CEFTRIAXONE SODIUM IN DEXTROSE 20 MG/ML IV SOLN: 1.0000 g | INTRAVENOUS | Status: AC

## 2014-04-22 MED ORDER — HEPARIN SOD (PORK) LOCK FLUSH 100 UNIT/ML IV SOLN
250.0000 [IU] | INTRAVENOUS | Status: AC | PRN
  Administered 2014-04-22: 250 [IU]

## 2014-04-22 MED ORDER — VITAL 1.5 CAL PO LIQD: 1000.0000 mL | ORAL | Status: AC

## 2014-04-22 NOTE — Progress Notes (Signed)
PATIENT DETAILS Name: Sierra Kerr Age: 80 y.o. Sex: female Date of Birth: [birth year] Admit Date: 04/17/2014 Admitting Physician Oretha Milch, MD [id number], [facility], MD  Brief Narrative:  80 year old female with PMH DM, and PAF. Was admitted to [facility] for septic shock For perforated cholecystitis with surgical repair 01/29/2014. She was an mechanical ventilator for the entirety of her stay, which was complicated by AF RVR, C-diff, and refractory respiratory failure which ultimately resulted in trach/PEG 2/5. Also had upper extremity DVT from line, however unable to tolerate anticoagulation due to hemoptysis. She was admitted to [facility] 2/23. Since her admission there she has shown minimal functional improvement, however it sounds as though she has progressed to tolerate some capping trials with trach. While at the [facility] wing of [facility], patient was noted to have more depressed level of responsiveness than her usual baseline, lab work showed profound hyponatremia, hyperkalemia and hyperglycemia. She was then sent to the emergency room for further evaluation, and subsequently required transfer to the intensive care unit for further management. Upon stabilization, she was transferred to [facility] on 04/22/14  Subjective: Non verbal, tracks movement with eyes.  Assessment/Plan: Active Problems:   Hypernatremia: Suspect likely secondary to dehydration. Admitted and started on isotonic saline, once volume status better, patient was then started on hypotonic saline. Nephrology was also consulted. Sodium has slowly decreased, currently down 152. On admission sodium was significantly elevated at 174. We will continue with D5 W, but would increase free water via the PEG tube. Recheck electrolytes in a.m.    Acute renal failure: Likely prerenal azotemia from dehydration. Creatinine on admission was at 3.22, currently creatinine has normalized with IV fluids. Continue to monitor electrolytes  periodically   Hyperosmolar hyperglycemic nonketotic state: Required insulin infusion on admission, CBGs now stable with just SSI. A1c only at 6.5.    Hyperkalemia: Seen on admission, likely secondary to acute renal failure. Has resolved with supportive care.    Severe sepsis: Secondary to UTI and pneumonia. Sepsis pathophysiology has resolved. Blood culture negative (one set did show coagulase-negative staph-likely a contamination), urine culture positive Escherichia coli sensitive to ceftriaxone.     UTI: Continue Rocephin, has been on antibiotics since 3/31 (previously on vancomycin and Zosyn). Will complete a seven-day course of antibiotics-last day of Rocephin 04/23/14     Healthcare associated pneumonia: On admission was started on vancomycin and Zosyn, all cultures continue to be negative, antibiotics has not been narrowed down to just Rocephin. As noted above, will plan on at least 7 days of antibiotics-stop date of Rocephin 04/23/14     Acute on chronic hypoxemic respiratory failure: Status post trach, back on trach collar. Acute respiratory failure likely secondary to acute illness and pneumonia.     Hypertension: Will resume low-dose metoprolol and follow.       Atrial fibrillation with rapid ventricular response: Occurred while in the intensive care unit. Currently in sinus rhythm. Resuming metoprolol. Monitor in telemetry.CHADS2VASC of 6-suspect very poor candidate for anticoagulation given on the above issues noted. Also, did not tolerate anticoagulation because of several episodes of hemoptysis and bloody secretions recently (please see discharge summary from [facility])     Anemia: Secondary to critical illness and chronic disease. Hemoglobin stable at 7.4. No overt blood loss evident, we'll transfuse if less than 7.     Thrombocytopenia: Platelet count rebounding. Suspect secondary to sepsis.      6 mm nodule in the right lung apex:  Repeat CT chest in 6-12 months    History of  cholecystitis with a perforated gallbladder and septic shock with prolonged chemical ventilation-status post tracheostomy and PEG tube placement. Discharge from [facility] on 03/11/14  Disposition: Remain inpatient-LTACH -When bed available  Antibiotics:  See below   Anti-infectives    Start     Dose/Rate Route Frequency Ordered Stop   04/21/14 1200  cefTRIAXone (ROCEPHIN) 1 g in dextrose 5 % 50 mL IVPB - Premix     1 g 100 mL/hr over 30 Minutes Intravenous Every 24 hours 04/21/14 1121     04/20/14 1600  vancomycin (VANCOCIN) IVPB 1000 mg/200 mL premix  Status:  Discontinued     1,000 mg 200 mL/hr over 60 Minutes Intravenous Every 24 hours 04/20/14 1455 04/21/14 0929   04/20/14 1530  piperacillin-tazobactam (ZOSYN) IVPB 3.375 g  Status:  Discontinued     3.375 g 12.5 mL/hr over 240 Minutes Intravenous 3 times per day 04/20/14 1454 04/21/14 0929   04/19/14 2330  vancomycin (VANCOCIN) IVPB 750 mg/150 ml premix  Status:  Discontinued     750 mg 150 mL/hr over 60 Minutes Intravenous Every 48 hours 04/17/14 2313 04/18/14 1239   04/18/14 2100  vancomycin (VANCOCIN) 500 mg in sodium chloride 0.9 % 100 mL IVPB  Status:  Discontinued     500 mg 100 mL/hr over 60 Minutes Intravenous Every 24 hours 04/18/14 1239 04/20/14 1455   04/18/14 1400  piperacillin-tazobactam (ZOSYN) IVPB 2.25 g  Status:  Discontinued     2.25 g 100 mL/hr over 30 Minutes Intravenous 3 times per day 04/18/14 1231 04/20/14 1454   04/18/14 0530  piperacillin-tazobactam (ZOSYN) IVPB 3.375 g  Status:  Discontinued     3.375 g 12.5 mL/hr over 240 Minutes Intravenous Every 8 hours 04/17/14 2313 04/18/14 1231   04/17/14 2330  piperacillin-tazobactam (ZOSYN) IVPB 3.375 g     3.375 g 100 mL/hr over 30 Minutes Intravenous  Once 04/17/14 2303 04/18/14 0020   04/17/14 2330  vancomycin (VANCOCIN) IVPB 1000 mg/200 mL premix     1,000 mg 200 mL/hr over 60 Minutes Intravenous  Once 04/17/14 2313 04/18/14 0052   04/17/14 2100  cefTRIAXone  (ROCEPHIN) 2 g in dextrose 5 % 50 mL IVPB  Status:  Discontinued     2 g 100 mL/hr over 30 Minutes Intravenous  Once 04/17/14 2052 04/17/14 2053   04/17/14 2000  cefTRIAXone (ROCEPHIN) 1 g in dextrose 5 % 50 mL IVPB     1 g 100 mL/hr over 30 Minutes Intravenous  Once 04/17/14 1956 04/17/14 2159      DVT Prophylaxis:  SCD's  Code Status: Full code   Family Communication Sister-Ms Hinson over the phone  Procedures:  NOne  CONSULTS:  pulmonary/intensive care  Time spent 40 minutes-which includes 50% of the time with face-to-face with patient/ family and coordinating care related to the above assessment and plan.  MEDICATIONS: Scheduled Meds: . antiseptic oral rinse  7 mL Mouth Rinse q12n4p  . cefTRIAXone (ROCEPHIN)  IV  1 g Intravenous Q24H  . chlorhexidine  15 mL Mouth Rinse BID  . feeding supplement (PRO-STAT SUGAR FREE 64)  30 mL Per Tube BID  . free water  300 mL Per Tube Q6H  . insulin aspart  0-15 Units Subcutaneous 6 times per day  . sodium chloride  3 mL Intravenous Q12H   Continuous Infusions: . dextrose 175 mL/hr at 04/22/14 0715  . feeding supplement (VITAL 1.5 CAL) 1,000 mL (04/21/14 2157)  PRN Meds:.dextrose, sodium chloride    PHYSICAL EXAM: Vital signs in last 24 hours: Filed Vitals:   04/22/14 0535 04/22/14 0827 04/22/14 1200 04/22/14 1356  BP: 155/61   148/52  Pulse: 94 86 90   Temp: 98.6 F (37 C)   98.5 F (36.9 C)  TempSrc: Oral   Oral  Resp: Height:      Weight:      SpO2: 100% 100% 100% 100%    Weight change: 1.12 kg (2 lb 7.5 oz) Filed Weights   04/21/14 0500 04/21/14 0555 04/22/14 0531  Weight: 79 kg (174 lb 2.6 oz) 78.8 kg (173 lb 11.6 oz) 80 kg (176 lb 5.9 oz)   Body mass index is 31.25 kg/(m^2).   Gen Exam: Awake-tracks movement. But nonverbal Neck: Supple, No JVD. Trach collar in place Chest: B/L Clear.  CVS: S1 S2 Regular, no murmurs.  Abdomen: soft, BS +, non tender, non distended. RUQ incision-slightly  open but mostly dry. Extremities: no edema, lower extremities warm to touch.  Intake/Output from previous day:  Intake/Output Summary (Last 24 hours) at 04/22/14 1423 Last data filed at 04/22/14 1131  Gross per 24 hour  Intake 1987.08 ml  Output   3600 ml  Net -1612.92 ml     LAB RESULTS: CBC  Recent Labs Lab 04/18/14 0243 04/19/14 0420 04/20/14 0555 04/21/14 0355 04/22/14 0742  WBC 13.0* 7.9 9.3 7.3 7.5  HGB 7.8* 7.3* 7.3* 7.0* 7.4*  HCT 28.9* 26.6* 26.1* 25.0* 25.8*  PLT 43* 47* 59* 67* 78*  MCV 104.3* 100.8* 98.5 98.4 97.0  MCH 28.2 27.7 27.5 27.6 27.8  MCHC 27.0* 27.4* 28.0* 28.0* 28.7*  RDW 19.2* 18.9* 18.7* 18.2* 17.3*  LYMPHSABS 2.7 2.3 2.8 1.8 2.3  MONOABS 0.5 0.4 0.5 0.3 0.5  EOSABS 0.1 0.2 0.3 0.4 0.4  BASOSABS 0.0 0.0 0.0 0.0 0.0    Chemistries   Recent Labs Lab 04/19/14 1244  04/19/14 2355  04/20/14 2250 04/21/14 0355 04/21/14 1200 04/21/14 1410 04/21/14 2259 04/22/14 0742  NA 169*  < > 168*  < > 160* 165*  --  158* 150* 152*  K 3.6  < > 3.0*  < > 3.4* 3.8  --  3.4* 3.5 3.6  CL >130*  < > >130*  < > >130* >130*  --  >130* 128* 127*  CO2 19  < > 20  < > 14* 22  --  GLUCOSE 85  < > 161*  < > 188* 180*  --  228* 255* 171*  BUN 121*  < > 107*  < > 69* 65*  --  54* 43* 36*  CREATININE 1.65*  < > 1.54*  < > 1.10 1.07  --  0.94 0.86 0.73  CALCIUM 9.3  < > 9.0  < > 8.6 9.1  --  8.7 8.6 8.9  MG 3.3*  --  2.9*  --  2.7*  --  2.4  --  2.3  --   < > = values in this interval not displayed.  CBG:  Recent Labs Lab 04/21/14 2201 04/22/14 0027 04/22/14 0402 04/22/14 0831 04/22/14 1157  GLUCAP 238* 228* 158* 171* 167*    GFR Estimated Creatinine Clearance: 58.9 mL/min (by C-G formula based on Cr of 0.73).  Coagulation profile  Recent Labs Lab 04/17/14 2151  INR 1.51*    Cardiac Enzymes No results for input(s): CKMB, TROPONINI, MYOGLOBIN in the last 168 hours.  Invalid input(s): CK  Invalid input(s): POCBNP No results for  input(s): DDIMER in the last 72 hours. No results for input(s): HGBA1C in the last 72 hours. No results for input(s): CHOL, HDL, LDLCALC, TRIG, CHOLHDL, LDLDIRECT in the last 72 hours. No results for input(s): TSH, T4TOTAL, T3FREE, THYROIDAB in the last 72 hours.  Invalid input(s): FREET3 No results for input(s): VITAMINB12, FOLATE, FERRITIN, TIBC, IRON, RETICCTPCT in the last 72 hours. No results for input(s): LIPASE, AMYLASE in the last 72 hours.  Urine Studies No results for input(s): UHGB, CRYS in the last 72 hours.  Invalid input(s): UACOL, UAPR, USPG, UPH, UTP, UGL, UKET, UBIL, UNIT, UROB, ULEU, UEPI, UWBC, URBC, UBAC, CAST, UCOM, BILUA  MICROBIOLOGY: Recent Results (from the past 240 hour(s))  Blood Culture (routine x 2)     Status: None   Collection Time: 04/17/14  6:02 PM  Result Value Ref Range Status   Specimen Description BLOOD HAND LEFT  Final   Special Requests BOTTLES DRAWN AEROBIC AND ANAEROBIC 10CC  Final   Culture   Final    STAPHYLOCOCCUS SPECIES (COAGULASE NEGATIVE) Note: THE SIGNIFICANCE OF ISOLATING THIS ORGANISM FROM A SINGLE SET OF BLOOD CULTURES WHEN MULTIPLE SETS ARE DRAWN IS UNCERTAIN. PLEASE NOTIFY THE MICROBIOLOGY DEPARTMENT WITHIN ONE WEEK IF SPECIATION AND SENSITIVITIES ARE REQUIRED. Note: Gram Stain Report Called to,Read Back By and Verified With: Marcell Anger [id number] BJENN Performed at [facility]    Report Status 04/20/2014 FINAL  Final  Blood Culture (routine x 2)     Status: None (Preliminary result)   Collection Time: 04/17/14  6:14 PM  Result Value Ref Range Status   Specimen Description BLOOD HAND RIGHT  Final   Special Requests BOTTLES DRAWN AEROBIC AND ANAEROBIC 5CC  Final   Culture   Final           BLOOD CULTURE RECEIVED NO GROWTH TO DATE CULTURE WILL BE HELD FOR 5 DAYS BEFORE ISSUING A FINAL NEGATIVE REPORT Performed at [facility]    Report Status PENDING  Incomplete  Urine culture     Status: None   Collection  Time: 04/17/14  6:45 PM  Result Value Ref Range Status   Specimen Description URINE, CATHETERIZED  Final   Special Requests NONE  Final   Colony Count   Final    >=100,000 COLONIES/ML Performed at [facility]    Culture   Final    ESCHERICHIA COLI Performed at [facility]    Report Status 04/20/2014 FINAL  Final   Organism ID, Bacteria ESCHERICHIA COLI  Final      Susceptibility   Escherichia coli - MIC*    AMPICILLIN 4 SENSITIVE Sensitive     CEFAZOLIN <=4 SENSITIVE Sensitive     CEFTRIAXONE <=1 SENSITIVE Sensitive     CIPROFLOXACIN >=4 RESISTANT Resistant     GENTAMICIN <=1 SENSITIVE Sensitive     LEVOFLOXACIN >=8 RESISTANT Resistant     NITROFURANTOIN <=16 SENSITIVE Sensitive     TOBRAMYCIN <=1 SENSITIVE Sensitive     TRIMETH/SULFA <=20 SENSITIVE Sensitive     PIP/TAZO <=4 SENSITIVE Sensitive     * ESCHERICHIA COLI  MRSA PCR Screening     Status: None   Collection Time: 04/18/14  2:55 AM  Result Value Ref Range Status   MRSA by PCR NEGATIVE NEGATIVE Final    Comment:        The GeneXpert MRSA Assay (FDA approved for NASAL specimens only), is one component of a comprehensive MRSA colonization surveillance program.  It is not intended to diagnose MRSA infection nor to guide or monitor treatment for MRSA infections.   Clostridium Difficile by PCR     Status: None   Collection Time: 04/20/14  5:35 AM  Result Value Ref Range Status   C difficile by pcr NEGATIVE NEGATIVE Final    RADIOLOGY STUDIES/RESULTS: Ct Abdomen Pelvis Wo Contrast  04/17/2014   CLINICAL DATA:  Hypotension, abnormal labs. Chronic respiratory failure.  EXAM: CT CHEST, ABDOMEN AND PELVIS WITHOUT CONTRAST  TECHNIQUE: Multidetector CT imaging of the chest, abdomen and pelvis was performed following the standard protocol without IV contrast.  COMPARISON:  Chest radiograph earlier this day.  No remote imaging.  FINDINGS: CT CHEST FINDINGS  Tracheostomy tube with tip at the thoracic inlet.  Tip of the left central line in the SVC. There is dense airspace consolidation in the dependent right lower lobe. Detailed evaluation is limited by a motion artifact, no definite obstructing lesion is seen. Nodular and confluent opacity in the dependent left lower lobe to a lesser extent. 6 mm spiculated nodule at the right lung apex. Emphysematous change with scattered blebs.  No definite pleural effusion. No pericardial effusion. The heart is at the upper limits of normal. Mild coronary artery calcifications. There are calcified bilateral hilar lymph and subcarinal nodes. Mild atherosclerosis of the thoracic aorta, no aneurysm.  CT ABDOMEN AND PELVIS FINDINGS  Evaluation of the abdomen is limited given lack of contrast, arms down positioning, and mild patient motion. Gastrostomy tube is seen in the stomach. There are no dilated or thickened bowel loops. Multiple colonic diverticular primarily in the distal descending and sigmoid colon without diverticulitis. There is faint fat stranding in the left pericolic gutter, no associated bowel abnormalities. The appendix is prominent in size measuring 8 mm, however there is no surrounding inflammatory change.  The unenhanced liver, pancreas, and adrenal glands are normal. Small splenic granuloma, spleen is normal in size. Gallbladder is not seen, surgical clips tentatively seen in the gallbladder fossa.  Kidneys are symmetric in size. Mild prominence of both renal pelvis these without hydronephrosis. No significant perinephric stranding. No renal stones. There is a 3.1 x 2.3 cm hypodensity in the lower right kidney with Hounsfield units of 26.  Abdominal aorta is normal in caliber mild atherosclerosis. No retroperitoneal adenopathy. No free air, free fluid, or intra-abdominal fluid collection.  Within the pelvis there are calcified uterine fibroids. Urinary bladder is decompressed by Foley catheter. There is question of perivesicular inflammatory change. No large adnexal  mass. No pelvic free fluid.  Scattered soft tissue densities and air in the anterior abdominal wall, commonly seen with subcutaneous injections.  Review of the osseous structures of the chest abdomen and pelvis demonstrates no acute or suspicious osseous abnormality. There is multilevel degenerative change throughout the thoracic and lumbar spine. Degenerative change noted about both sacroiliac joints.  IMPRESSION: 1. Bilateral lower lobe airspace consolidation, right greater than left. This may reflect pneumonia, however aspiration could have a similar appearance given distribution. 2. Spiculated 6 mm nodule in the right lung apex. Comparison with any prior imaging would be most helpful as this is a common location for scarring. In the absence of prior imaging, if the patient is at high risk for bronchogenic carcinoma, follow-up chest CT at 6-12 months is recommended. If the patient is at low risk for bronchogenic carcinoma, follow-up chest CT at 12 months is recommended. This recommendation follows the consensus statement: Guidelines for Management of Small Pulmonary Nodules Detected on CT  Scans: A Statement from the Fleischner Society as published in Radiology 2005;237:395-400. 3. Sequela of prior granulomatous disease with calcified bilateral hilar and subcarinal lymph nodes. Scattered emphysematous blebs. 4. Foley catheter within the urinary bladder, however suggestion of perivesicular inflammatory change. Findings suggest urinary tract infection. There is mild soft tissue edema in the left pericolic gutter, without subjacent inflammation of the colon or bowel loops, may reflect tracking inflammation. 5. Hypodense lesion in the lower right kidney measuring 3.1 cm. This does not measures simple fluid density, however is incompletely characterized without contrast. Correlation with any prior imaging to evaluate for size stability recommended. In the absence of prior imaging, further characterization is necessary.  Initial evaluation could be considered in the setting of renal failure with ultrasound. 6. Incidental findings of diverticulosis. No diverticulitis. The appendix appears prominent in size, however there is no surrounding inflammatory change and this is likely normal for this patient.   Electronically Signed   By: Rubye Oaks M.D.   On: 04/17/2014 21:25   Ct Head Wo Contrast  04/17/2014   CLINICAL DATA:  Decreased level of consciousness. Altered mental status. Initial encounter.  EXAM: CT HEAD WITHOUT CONTRAST  TECHNIQUE: Contiguous axial images were obtained from the base of the skull through the vertex without intravenous contrast.  COMPARISON:  None.  FINDINGS: No mass lesion, mass effect, midline shift, hydrocephalus, hemorrhage. No acute territorial cortical ischemia/infarct. Atrophy. Hyperostosis of the skull is present. Mild motion artifact is present on the examination. Paranasal sinuses appear within normal limits. Tiny area of low attenuation is present in the LEFT brainstem, probably representing a lacunar infarct.  IMPRESSION: Atrophy without acute intracranial abnormality.   Electronically Signed   By: Andreas Newport M.D.   On: 04/17/2014 21:36   Ct Chest Wo Contrast  04/17/2014   CLINICAL DATA:  Hypotension, abnormal labs. Chronic respiratory failure.  EXAM: CT CHEST, ABDOMEN AND PELVIS WITHOUT CONTRAST  TECHNIQUE: Multidetector CT imaging of the chest, abdomen and pelvis was performed following the standard protocol without IV contrast.  COMPARISON:  Chest radiograph earlier this day.  No remote imaging.  FINDINGS: CT CHEST FINDINGS  Tracheostomy tube with tip at the thoracic inlet. Tip of the left central line in the SVC. There is dense airspace consolidation in the dependent right lower lobe. Detailed evaluation is limited by a motion artifact, no definite obstructing lesion is seen. Nodular and confluent opacity in the dependent left lower lobe to a lesser extent. 6 mm spiculated nodule  at the right lung apex. Emphysematous change with scattered blebs.  No definite pleural effusion. No pericardial effusion. The heart is at the upper limits of normal. Mild coronary artery calcifications. There are calcified bilateral hilar lymph and subcarinal nodes. Mild atherosclerosis of the thoracic aorta, no aneurysm.  CT ABDOMEN AND PELVIS FINDINGS  Evaluation of the abdomen is limited given lack of contrast, arms down positioning, and mild patient motion. Gastrostomy tube is seen in the stomach. There are no dilated or thickened bowel loops. Multiple colonic diverticular primarily in the distal descending and sigmoid colon without diverticulitis. There is faint fat stranding in the left pericolic gutter, no associated bowel abnormalities. The appendix is prominent in size measuring 8 mm, however there is no surrounding inflammatory change.  The unenhanced liver, pancreas, and adrenal glands are normal. Small splenic granuloma, spleen is normal in size. Gallbladder is not seen, surgical clips tentatively seen in the gallbladder fossa.  Kidneys are symmetric in size. Mild prominence of both renal pelvis  these without hydronephrosis. No significant perinephric stranding. No renal stones. There is a 3.1 x 2.3 cm hypodensity in the lower right kidney with Hounsfield units of 26.  Abdominal aorta is normal in caliber mild atherosclerosis. No retroperitoneal adenopathy. No free air, free fluid, or intra-abdominal fluid collection.  Within the pelvis there are calcified uterine fibroids. Urinary bladder is decompressed by Foley catheter. There is question of perivesicular inflammatory change. No large adnexal mass. No pelvic free fluid.  Scattered soft tissue densities and air in the anterior abdominal wall, commonly seen with subcutaneous injections.  Review of the osseous structures of the chest abdomen and pelvis demonstrates no acute or suspicious osseous abnormality. There is multilevel degenerative change  throughout the thoracic and lumbar spine. Degenerative change noted about both sacroiliac joints.  IMPRESSION: 1. Bilateral lower lobe airspace consolidation, right greater than left. This may reflect pneumonia, however aspiration could have a similar appearance given distribution. 2. Spiculated 6 mm nodule in the right lung apex. Comparison with any prior imaging would be most helpful as this is a common location for scarring. In the absence of prior imaging, if the patient is at high risk for bronchogenic carcinoma, follow-up chest CT at 6-12 months is recommended. If the patient is at low risk for bronchogenic carcinoma, follow-up chest CT at 12 months is recommended. This recommendation follows the consensus statement: Guidelines for Management of Small Pulmonary Nodules Detected on CT Scans: A Statement from the Fleischner Society as published in Radiology 2005;237:395-400. 3. Sequela of prior granulomatous disease with calcified bilateral hilar and subcarinal lymph nodes. Scattered emphysematous blebs. 4. Foley catheter within the urinary bladder, however suggestion of perivesicular inflammatory change. Findings suggest urinary tract infection. There is mild soft tissue edema in the left pericolic gutter, without subjacent inflammation of the colon or bowel loops, may reflect tracking inflammation. 5. Hypodense lesion in the lower right kidney measuring 3.1 cm. This does not measures simple fluid density, however is incompletely characterized without contrast. Correlation with any prior imaging to evaluate for size stability recommended. In the absence of prior imaging, further characterization is necessary. Initial evaluation could be considered in the setting of renal failure with ultrasound. 6. Incidental findings of diverticulosis. No diverticulitis. The appendix appears prominent in size, however there is no surrounding inflammatory change and this is likely normal for this patient.   Electronically  Signed   By: Rubye Oaks M.D.   On: 04/17/2014 21:25   US Renal  04/18/2014   CLINICAL DATA:  Acute renal failure.  EXAM: RENAL/URINARY TRACT ULTRASOUND COMPLETE  COMPARISON:  Noncontrast CT 4 hr prior.  FINDINGS: Right Kidney:  Length: 11.1 cm. There is thinning of the renal parenchyma and increased renal echogenicity. No hydronephrosis. Small hypoechoic structures consistent with cysts, largest in the lower pole measuring 2.2 x 2.7 x 2.5 cm corresponding to questioned abnormality on CT.  Left Kidney:  Length: 11.2 cm. Increased renal echogenicity and thinning of renal parenchyma. No mass or hydronephrosis visualized.  Bladder:  Decompressed by Foley catheter and not assessed.  IMPRESSION: 1. Echogenic kidneys with thinning of the renal parenchyma consistent with chronic medical renal disease. No obstructive uropathy. 2. Right renal cysts, largest in the lower pole measuring 2.7 cm. This corresponds to the indeterminate abnormality on CT.   Electronically Signed   By: Rubye Oaks M.D.   On: 04/18/2014 02:07   Dg Chest Port 1 View  04/18/2014   CLINICAL DATA:  Code sepsis.  Pneumonia.  EXAM: PORTABLE CHEST -  1 VIEW  COMPARISON:  Chest CT 3 hr prior.  Chest radiographs 7 hr prior.  FINDINGS: Bibasilar consolidations, better appreciated on prior CT. Worsening of left lower lobe aeration from prior radiograph. Tracheostomy tube remains in place. Left internal jugular central venous catheter tip in the SVC. The heart size is normal. Minimal blunting of right costophrenic angle consistent with effusion. No pneumothorax.  IMPRESSION: Bibasilar consolidations, better appreciated on chest CT. Left lower lobe aeration has worsened from prior chest radiograph.   Electronically Signed   By: Rubye Oaks M.D.   On: 04/18/2014 00:42   Dg Chest Port 1 View  04/17/2014   CLINICAL DATA:  Hypotension for 1 day. Altered mental status. Abnormal laboratory results.  EXAM: PORTABLE CHEST - 1 VIEW  COMPARISON:  None.   FINDINGS: Support apparatus: Tracheostomy is present with the tip at the level of the clavicles. LEFT IJ central line is present with the tip in the mid superior vena cava. Monitoring leads project over the chest.  Cardiomediastinal Silhouette:  Within normal limits.  Lungs: Retrocardiac density likely represents atelectasis. No focal consolidation. No pneumothorax.  Effusions:  None.  Other:  None.  IMPRESSION: 1. Support apparatus appears in good position. 2. No acute cardiopulmonary disease.   Electronically Signed   By: Andreas Newport M.D.   On: 04/17/2014 18:07    Jeoffrey Massed, MD  [facility] Pager:[phone]  If 7PM-7AM, please contact night-coverage [email or web address] Password [email or web address] 04/22/2014, 2:23 PM   LOS: 5 days

## 2014-04-22 NOTE — Progress Notes (Signed)
Sierra Kerr discharged [facility] per MD order.  Report called to receiving Kyra SearlesJoan Kiand[facility] room 206 with Dr. Ellsworth Lennoxejan-Sie being the admitting MD.     Medication List    STOP taking these medications        acetaminophen 325 MG tablet  Commonly known as:  TYLENOL     aspirin 81 MG tablet     chlorhexidine 0.12 % solution  Commonly known as:  PERIDEX     clonazePAM 0.5 MG tablet  Commonly known as:  KLONOPIN     diltiazem 360 MG 24 hr capsule  Commonly known as:  TIAZAC     enoxaparin 40 MG/0.4ML injection  Commonly known as:  LOVENOX     furosemide 40 MG tablet  Commonly known as:  LASIX     modafinil 200 MG tablet  Commonly known as:  PROVIGIL     potassium chloride 20 MEQ packet  Commonly known as:  KLOR-CON     valsartan-hydrochlorothiazide 320-25 MG per tablet  Commonly known as:  DIOVAN-HCT      TAKE these medications        cefTRIAXone 20 MG/ML IVPB  Commonly known as:  ROCEPHIN  Inject 50 mLs (1 g total) into the vein daily.     dextrose 5 % solution  150 mL an hour     dextrose 50 % solution  Inject 25 mLs into the vein as needed for low blood sugar (for CBG < 70 mg/dL).     feeding supplement (PRO-STAT SUGAR FREE 64) Liqd  Place 30 mLs into feeding tube 2 (two) times daily.     feeding supplement (VITAL 1.5 CAL) Liqd  Place 1,000 mLs into feeding tube continuous. 45 mL/hr     free water Soln  Place 300 mLs into feeding tube every 6 (six) hours.     insulin aspart 100 UNIT/ML injection  Commonly known as:  novoLOG  Inject 0-15 Units into the skin every 4 (four) hours. Per sliding scale     ipratropium-albuterol 0.5-2.5 (3) MG/3ML Soln  Commonly known as:  DUONEB  Take 3 mLs by nebulization every 6 (six) hours as needed (wheezing).     LORazepam 1 MG tablet  Commonly known as:  ATIVAN  Place 1 mg into feeding tube every 6 (six) hours as needed for anxiety.     metoprolol tartrate 25 MG tablet  Commonly known as:   LOPRESSOR  12.5 mg by PEG Tube route every 12 (twelve) hours.     ondansetron 4 MG disintegrating tablet  Commonly known as:  ZOFRAN-ODT  4 mg by PEG Tube route every 8 (eight) hours as needed for nausea or vomiting.     OxyCODONE HCl (Abuse Deter) 5 MG Taba  5 mg by PEG Tube route every 8 (eight) hours as needed.     polyvinyl alcohol 1.4 % ophthalmic solution  Commonly known as:  LIQUIFILM TEARS  Place 1 drop into both eyes as needed for dry eyes.     simethicone 80 MG chewable tablet  Commonly known as:  MYLICON  80 mg by PEG Tube route every 6 (six) hours as needed for flatulence.        Patients skin is clean, dry and with wound to right flank. Right upper arm single lumen PICC IV site will remains intact at discharge, along with foley catheter. Site without signs and symptoms of complications. Dressing and pressure applied.  Patient will be transported on a  stretcher by [facility],  no distress noted upon discharge.  [name] C 04/22/2014 8:07 PM2

## 2014-04-22 NOTE — Discharge Summary (Signed)
PATIENT DETAILS Name: Sierra Kerr Age: 80 y.o. Sex: female Date of Birth: 03/11/1934 MRN: [id number]. Admitting Physician: Oretha Milch, MD WUJ:[id number], [facility], MD  Admit Date: 04/17/2014 Discharge date: 04/22/2014  Recommendations for Outpatient Follow-up:  1. Daily chemistries still sodium normalizes-currently on D5 water at 150 mL an hour 2. Continue sliding scale insulin for now-A1c only at 6.5. Once acute issues are oval, consider starting oral hypoglycemic agents. 3. Currently on day 6 of antimicrobial treatment-continue Rocephin to complete at least 7-10 day course for complicated UTI and healthcare associated pneumonia. 4.  6 mm nodule in the right lung apex: Repeat CT chest in 6-12 months 5. Monitor daily CBC till platelet count normalizes. Avoid Lovenox or heparin till platelets are back to normal  PRIMARY DISCHARGE DIAGNOSIS:  Active Problems:   Hypernatremia   Sepsis   Encephalopathy   Hyperkalemia   Hyperglycemia   AKI (acute kidney injury)   UTI (lower urinary tract infection)   Severe sepsis   Absolute anemia   Chronic respiratory failure      PAST MEDICAL HISTORY: Past Medical History  Diagnosis Date  . Diabetes mellitus without complication   . Hypertension   . A-fib   . Respiratory failure, acute and chronic   . Sepsis 01/28/14    Perforated choly operated on at [facility]; septic post op.    DISCHARGE MEDICATIONS: Current Discharge Medication List    START taking these medications   Details  Amino Acids-Protein Hydrolys (FEEDING SUPPLEMENT, PRO-STAT SUGAR FREE 64,) LIQD Place 30 mLs into feeding tube 2 (two) times daily. Qty: 900 mL, Refills: 0    cefTRIAXone (ROCEPHIN) 20 MG/ML IVPB Inject 50 mLs (1 g total) into the vein daily. Qty: 50 mL    dextrose 5 % solution 150 mL an hour Qty: 50 mL    dextrose 50 % solution Inject 25 mLs into the vein as needed for low blood sugar (for CBG < 70 mg/dL). Qty: 50 mL    insulin aspart (NOVOLOG)  100 UNIT/ML injection Inject 0-15 Units into the skin every 4 (four) hours. Per sliding scale Qty: 10 mL, Refills: 11    Nutritional Supplements (FEEDING SUPPLEMENT, VITAL 1.5 CAL,) LIQD Place 1,000 mLs into feeding tube continuous. 45 mL/hr    Water For Irrigation, Sterile (FREE WATER) SOLN Place 300 mLs into feeding tube every 6 (six) hours.      CONTINUE these medications which have NOT CHANGED   Details  ipratropium-albuterol (DUONEB) 0.5-2.5 (3) MG/3ML SOLN Take 3 mLs by nebulization every 6 (six) hours as needed (wheezing).    LORazepam (ATIVAN) 1 MG tablet Place 1 mg into feeding tube every 6 (six) hours as needed for anxiety.    metoprolol tartrate (LOPRESSOR) 25 MG tablet 12.5 mg by PEG Tube route every 12 (twelve) hours.    ondansetron (ZOFRAN-ODT) 4 MG disintegrating tablet 4 mg by PEG Tube route every 8 (eight) hours as needed for nausea or vomiting.    OxyCODONE HCl, Abuse Deter, 5 MG TABA 5 mg by PEG Tube route every 8 (eight) hours as needed.    polyvinyl alcohol (LIQUIFILM TEARS) 1.4 % ophthalmic solution Place 1 drop into both eyes as needed for dry eyes.    simethicone (MYLICON) 80 MG chewable tablet 80 mg by PEG Tube route every 6 (six) hours as needed for flatulence.      STOP taking these medications     acetaminophen (TYLENOL) 325 MG tablet      aspirin 81 MG  tablet      chlorhexidine (PERIDEX) 0.12 % solution      clonazePAM (KLONOPIN) 0.5 MG tablet      enoxaparin (LOVENOX) 40 MG/0.4ML injection      furosemide (LASIX) 40 MG tablet      modafinil (PROVIGIL) 200 MG tablet      potassium chloride (KLOR-CON) 20 MEQ packet      diltiazem (TIAZAC) 360 MG 24 hr capsule      valsartan-hydrochlorothiazide (DIOVAN-HCT) 320-25 MG per tablet         ALLERGIES:  No Known Allergies  BRIEF HPI:  See H&P, Labs, Consult and Test reports for all details in brief, 80 year old female with PMH DM, and PAF. Was admitted to [facility] for septic shock For perforated  cholecystitis with surgical repair 01/29/2014. She was an mechanical ventilator for the entirety of her stay, which was complicated by AF RVR, C-diff, and refractory respiratory failure which ultimately resulted in trach/PEG 2/5. Also had upper extremity DVT from line, however unable to tolerate anticoagulation due to hemoptysis. She was admitted to [facility] 2/23. Since her admission there she has shown minimal functional improvement, however it sounds as though she has progressed to tolerate some capping trials with trach. While at the SN[facility] wing of [facility], patient was noted to have more depressed level of responsiveness than her usual baseline, lab work showed profound hyponatremia, hyperkalemia and hyperglycemia. She was then sent to the emergency room for further evaluation  CONSULTATIONS:   pulmonary/intensive care and nephrology  PERTINENT RADIOLOGIC STUDIES: Ct Abdomen Pelvis Wo Contrast  04/17/2014   CLINICAL DATA:  Hypotension, abnormal labs. Chronic respiratory failure.  EXAM: CT CHEST, ABDOMEN AND PELVIS WITHOUT CONTRAST  TECHNIQUE: Multidetector CT imaging of the chest, abdomen and pelvis was performed following the standard protocol without IV contrast.  COMPARISON:  Chest radiograph earlier this day.  No remote imaging.  FINDINGS: CT CHEST FINDINGS  Tracheostomy tube with tip at the thoracic inlet. Tip of the left central line in the SVC. There is dense airspace consolidation in the dependent right lower lobe. Detailed evaluation is limited by a motion artifact, no definite obstructing lesion is seen. Nodular and confluent opacity in the dependent left lower lobe to a lesser extent. 6 mm spiculated nodule at the right lung apex. Emphysematous change with scattered blebs.  No definite pleural effusion. No pericardial effusion. The heart is at the upper limits of normal. Mild coronary artery calcifications. There are calcified bilateral hilar lymph and subcarinal nodes. Mild atherosclerosis  of the thoracic aorta, no aneurysm.  CT ABDOMEN AND PELVIS FINDINGS  Evaluation of the abdomen is limited given lack of contrast, arms down positioning, and mild patient motion. Gastrostomy tube is seen in the stomach. There are no dilated or thickened bowel loops. Multiple colonic diverticular primarily in the distal descending and sigmoid colon without diverticulitis. There is faint fat stranding in the left pericolic gutter, no associated bowel abnormalities. The appendix is prominent in size measuring 8 mm, however there is no surrounding inflammatory change.  The unenhanced liver, pancreas, and adrenal glands are normal. Small splenic granuloma, spleen is normal in size. Gallbladder is not seen, surgical clips tentatively seen in the gallbladder fossa.  Kidneys are symmetric in size. Mild prominence of both renal pelvis these without hydronephrosis. No significant perinephric stranding. No renal stones. There is a 3.1 x 2.3 cm hypodensity in the lower right kidney with Hounsfield units of 26.  Abdominal aorta is normal in caliber mild atherosclerosis.  No retroperitoneal adenopathy. No free air, free fluid, or intra-abdominal fluid collection.  Within the pelvis there are calcified uterine fibroids. Urinary bladder is decompressed by Foley catheter. There is question of perivesicular inflammatory change. No large adnexal mass. No pelvic free fluid.  Scattered soft tissue densities and air in the anterior abdominal wall, commonly seen with subcutaneous injections.  Review of the osseous structures of the chest abdomen and pelvis demonstrates no acute or suspicious osseous abnormality. There is multilevel degenerative change throughout the thoracic and lumbar spine. Degenerative change noted about both sacroiliac joints.  IMPRESSION: 1. Bilateral lower lobe airspace consolidation, right greater than left. This may reflect pneumonia, however aspiration could have a similar appearance given distribution. 2.  Spiculated 6 mm nodule in the right lung apex. Comparison with any prior imaging would be most helpful as this is a common location for scarring. In the absence of prior imaging, if the patient is at high risk for bronchogenic carcinoma, follow-up chest CT at 6-12 months is recommended. If the patient is at low risk for bronchogenic carcinoma, follow-up chest CT at 12 months is recommended. This recommendation follows the consensus statement: Guidelines for Management of Small Pulmonary Nodules Detected on CT Scans: A Statement from the Fleischner Society as published in Radiology 2005;237:395-400. 3. Sequela of prior granulomatous disease with calcified bilateral hilar and subcarinal lymph nodes. Scattered emphysematous blebs. 4. Foley catheter within the urinary bladder, however suggestion of perivesicular inflammatory change. Findings suggest urinary tract infection. There is mild soft tissue edema in the left pericolic gutter, without subjacent inflammation of the colon or bowel loops, may reflect tracking inflammation. 5. Hypodense lesion in the lower right kidney measuring 3.1 cm. This does not measures simple fluid density, however is incompletely characterized without contrast. Correlation with any prior imaging to evaluate for size stability recommended. In the absence of prior imaging, further characterization is necessary. Initial evaluation could be considered in the setting of renal failure with ultrasound. 6. Incidental findings of diverticulosis. No diverticulitis. The appendix appears prominent in size, however there is no surrounding inflammatory change and this is likely normal for this patient.   Electronically Signed   By: Rubye Oaks M.D.   On: 04/17/2014 21:25   Ct Head Wo Contrast  04/17/2014   CLINICAL DATA:  Decreased level of consciousness. Altered mental status. Initial encounter.  EXAM: CT HEAD WITHOUT CONTRAST  TECHNIQUE: Contiguous axial images were obtained from the base of the  skull through the vertex without intravenous contrast.  COMPARISON:  None.  FINDINGS: No mass lesion, mass effect, midline shift, hydrocephalus, hemorrhage. No acute territorial cortical ischemia/infarct. Atrophy. Hyperostosis of the skull is present. Mild motion artifact is present on the examination. Paranasal sinuses appear within normal limits. Tiny area of low attenuation is present in the LEFT brainstem, probably representing a lacunar infarct.  IMPRESSION: Atrophy without acute intracranial abnormality.   Electronically Signed   By: Andreas Newport M.D.   On: 04/17/2014 21:36   Ct Chest Wo Contrast  04/17/2014   CLINICAL DATA:  Hypotension, abnormal labs. Chronic respiratory failure.  EXAM: CT CHEST, ABDOMEN AND PELVIS WITHOUT CONTRAST  TECHNIQUE: Multidetector CT imaging of the chest, abdomen and pelvis was performed following the standard protocol without IV contrast.  COMPARISON:  Chest radiograph earlier this day.  No remote imaging.  FINDINGS: CT CHEST FINDINGS  Tracheostomy tube with tip at the thoracic inlet. Tip of the left central line in the SVC. There is dense airspace consolidation in the dependent  right lower lobe. Detailed evaluation is limited by a motion artifact, no definite obstructing lesion is seen. Nodular and confluent opacity in the dependent left lower lobe to a lesser extent. 6 mm spiculated nodule at the right lung apex. Emphysematous change with scattered blebs.  No definite pleural effusion. No pericardial effusion. The heart is at the upper limits of normal. Mild coronary artery calcifications. There are calcified bilateral hilar lymph and subcarinal nodes. Mild atherosclerosis of the thoracic aorta, no aneurysm.  CT ABDOMEN AND PELVIS FINDINGS  Evaluation of the abdomen is limited given lack of contrast, arms down positioning, and mild patient motion. Gastrostomy tube is seen in the stomach. There are no dilated or thickened bowel loops. Multiple colonic diverticular primarily  in the distal descending and sigmoid colon without diverticulitis. There is faint fat stranding in the left pericolic gutter, no associated bowel abnormalities. The appendix is prominent in size measuring 8 mm, however there is no surrounding inflammatory change.  The unenhanced liver, pancreas, and adrenal glands are normal. Small splenic granuloma, spleen is normal in size. Gallbladder is not seen, surgical clips tentatively seen in the gallbladder fossa.  Kidneys are symmetric in size. Mild prominence of both renal pelvis these without hydronephrosis. No significant perinephric stranding. No renal stones. There is a 3.1 x 2.3 cm hypodensity in the lower right kidney with Hounsfield units of 26.  Abdominal aorta is normal in caliber mild atherosclerosis. No retroperitoneal adenopathy. No free air, free fluid, or intra-abdominal fluid collection.  Within the pelvis there are calcified uterine fibroids. Urinary bladder is decompressed by Foley catheter. There is question of perivesicular inflammatory change. No large adnexal mass. No pelvic free fluid.  Scattered soft tissue densities and air in the anterior abdominal wall, commonly seen with subcutaneous injections.  Review of the osseous structures of the chest abdomen and pelvis demonstrates no acute or suspicious osseous abnormality. There is multilevel degenerative change throughout the thoracic and lumbar spine. Degenerative change noted about both sacroiliac joints.  IMPRESSION: 1. Bilateral lower lobe airspace consolidation, right greater than left. This may reflect pneumonia, however aspiration could have a similar appearance given distribution. 2. Spiculated 6 mm nodule in the right lung apex. Comparison with any prior imaging would be most helpful as this is a common location for scarring. In the absence of prior imaging, if the patient is at high risk for bronchogenic carcinoma, follow-up chest CT at 6-12 months is recommended. If the patient is at low  risk for bronchogenic carcinoma, follow-up chest CT at 12 months is recommended. This recommendation follows the consensus statement: Guidelines for Management of Small Pulmonary Nodules Detected on CT Scans: A Statement from the Fleischner Society as published in Radiology 2005;237:395-400. 3. Sequela of prior granulomatous disease with calcified bilateral hilar and subcarinal lymph nodes. Scattered emphysematous blebs. 4. Foley catheter within the urinary bladder, however suggestion of perivesicular inflammatory change. Findings suggest urinary tract infection. There is mild soft tissue edema in the left pericolic gutter, without subjacent inflammation of the colon or bowel loops, may reflect tracking inflammation. 5. Hypodense lesion in the lower right kidney measuring 3.1 cm. This does not measures simple fluid density, however is incompletely characterized without contrast. Correlation with any prior imaging to evaluate for size stability recommended. In the absence of prior imaging, further characterization is necessary. Initial evaluation could be considered in the setting of renal failure with ultrasound. 6. Incidental findings of diverticulosis. No diverticulitis. The appendix appears prominent in size, however there is no surrounding  inflammatory change and this is likely normal for this patient.   Electronically Signed   By: Rubye OaksMelanie  Ehinger M.D.   On: 04/17/2014 21:25   [place] Renal  04/18/2014   CLINICAL DATA:  Acute renal failure.  EXAM: RENAL/URINARY TRACT ULTRASOUND COMPLETE  COMPARISON:  Noncontrast CT 4 hr prior.  FINDINGS: Right Kidney:  Length: 11.1 cm. There is thinning of the renal parenchyma and increased renal echogenicity. No hydronephrosis. Small hypoechoic structures consistent with cysts, largest in the lower pole measuring 2.2 x 2.7 x 2.5 cm corresponding to questioned abnormality on CT.  Left Kidney:  Length: 11.2 cm. Increased renal echogenicity and thinning of renal parenchyma. No mass  or hydronephrosis visualized.  Bladder:  Decompressed by Foley catheter and not assessed.  IMPRESSION: 1. Echogenic kidneys with thinning of the renal parenchyma consistent with chronic medical renal disease. No obstructive uropathy. 2. Right renal cysts, largest in the lower pole measuring 2.7 cm. This corresponds to the indeterminate abnormality on CT.   Electronically Signed   By: Rubye OaksMelanie  Ehinger M.D.   On: 04/18/2014 02:07   Dg Chest Port 1 View  04/18/2014   CLINICAL DATA:  Code sepsis.  Pneumonia.  EXAM: PORTABLE CHEST - 1 VIEW  COMPARISON:  Chest CT 3 hr prior.  Chest radiographs 7 hr prior.  FINDINGS: Bibasilar consolidations, better appreciated on prior CT. Worsening of left lower lobe aeration from prior radiograph. Tracheostomy tube remains in place. Left internal jugular central venous catheter tip in the SVC. The heart size is normal. Minimal blunting of right costophrenic angle consistent with effusion. No pneumothorax.  IMPRESSION: Bibasilar consolidations, better appreciated on chest CT. Left lower lobe aeration has worsened from prior chest radiograph.   Electronically Signed   By: Rubye OaksMelanie  Ehinger M.D.   On: 04/18/2014 00:42   Dg Chest Port 1 View  04/17/2014   CLINICAL DATA:  Hypotension for 1 day. Altered mental status. Abnormal laboratory results.  EXAM: PORTABLE CHEST - 1 VIEW  COMPARISON:  None.  FINDINGS: Support apparatus: Tracheostomy is present with the tip at the level of the clavicles. LEFT IJ central line is present with the tip in the mid superior vena cava. Monitoring leads project over the chest.  Cardiomediastinal Silhouette:  Within normal limits.  Lungs: Retrocardiac density likely represents atelectasis. No focal consolidation. No pneumothorax.  Effusions:  None.  Other:  None.  IMPRESSION: 1. Support apparatus appears in good position. 2. No acute cardiopulmonary disease.   Electronically Signed   By: Andreas NewportGeoffrey  Lamke M.D.   On: 04/17/2014 18:07     PERTINENT LAB  RESULTS: CBC:  Recent Labs  04/21/14 0355 04/22/14 0742  WBC 7.3 7.5  HGB 7.0* 7.4*  HCT 25.0* 25.8*  PLT 67* 78*   CMET CMP     Component Value Date/Time   NA 152* 04/22/2014 0742   K 3.6 04/22/2014 0742   CL 127* 04/22/2014 0742   CO2 21 04/22/2014 0742   GLUCOSE 171* 04/22/2014 0742   BUN 36* 04/22/2014 0742   CREATININE 0.73 04/22/2014 0742   CALCIUM 8.9 04/22/2014 0742   PROT 6.7 04/19/2014 0800   ALBUMIN 1.5* 04/19/2014 0800   AST 26 04/19/2014 0800   ALT 17 04/19/2014 0800   ALKPHOS 61 04/19/2014 0800   BILITOT 0.8 04/19/2014 0800   GFRNONAA 80* 04/22/2014 0742   GFRAA >90 04/22/2014 0742    GFR Estimated Creatinine Clearance: 58.9 mL/min (by C-G formula based on Cr of 0.73). No results for input(s): LIPASE,  AMYLASE in the last 72 hours. No results for input(s): CKTOTAL, CKMB, CKMBINDEX, TROPONINI in the last 72 hours. Invalid input(s): POCBNP No results for input(s): DDIMER in the last 72 hours. No results for input(s): HGBA1C in the last 72 hours. No results for input(s): CHOL, HDL, LDLCALC, TRIG, CHOLHDL, LDLDIRECT in the last 72 hours. No results for input(s): TSH, T4TOTAL, T3FREE, THYROIDAB in the last 72 hours.  Invalid input(s): FREET3 No results for input(s): VITAMINB12, FOLATE, FERRITIN, TIBC, IRON, RETICCTPCT in the last 72 hours. Coags: No results for input(s): INR in the last 72 hours.  Invalid input(s): PT Microbiology: Recent Results (from the past 240 hour(s))  Blood Culture (routine x 2)     Status: None   Collection Time: 04/17/14  6:02 PM  Result Value Ref Range Status   Specimen Description BLOOD HAND LEFT  Final   Special Requests BOTTLES DRAWN AEROBIC AND ANAEROBIC 10CC  Final   Culture   Final    STAPHYLOCOCCUS SPECIES (COAGULASE NEGATIVE) Note: THE SIGNIFICANCE OF ISOLATING THIS ORGANISM FROM A SINGLE SET OF BLOOD CULTURES WHEN MULTIPLE SETS ARE DRAWN IS UNCERTAIN. PLEASE NOTIFY THE MICROBIOLOGY DEPARTMENT WITHIN ONE WEEK IF  SPECIATION AND SENSITIVITIES ARE REQUIRED. Note: Gram Stain Report Called to,Read Back By and Verified With: Marcell Anger [id number] Performed at [facility]    Report Status 04/20/2014 FINAL  Final  Blood Culture (routine x 2)     Status: None (Preliminary result)   Collection Time: 04/17/14  6:14 PM  Result Value Ref Range Status   Specimen Description BLOOD HAND RIGHT  Final   Special Requests BOTTLES DRAWN AEROBIC AND ANAEROBIC 5CC  Final   Culture   Final           BLOOD CULTURE RECEIVED NO GROWTH TO DATE CULTURE WILL BE HELD FOR 5 DAYS BEFORE ISSUING A FINAL NEGATIVE REPORT Performed at [facility]    Report Status PENDING  Incomplete  Urine culture     Status: None   Collection Time: 04/17/14  6:45 PM  Result Value Ref Range Status   Specimen Description URINE, CATHETERIZED  Final   Special Requests NONE  Final   Colony Count   Final    >=100,000 COLONIES/ML Performed at [facility]    Culture   Final    ESCHERICHIA COLI Performed at [facility]    Report Status 04/20/2014 FINAL  Final   Organism ID, Bacteria ESCHERICHIA COLI  Final      Susceptibility   Escherichia coli - MIC*    AMPICILLIN 4 SENSITIVE Sensitive     CEFAZOLIN <=4 SENSITIVE Sensitive     CEFTRIAXONE <=1 SENSITIVE Sensitive     CIPROFLOXACIN >=4 RESISTANT Resistant     GENTAMICIN <=1 SENSITIVE Sensitive     LEVOFLOXACIN >=8 RESISTANT Resistant     NITROFURANTOIN <=16 SENSITIVE Sensitive     TOBRAMYCIN <=1 SENSITIVE Sensitive     TRIMETH/SULFA <=20 SENSITIVE Sensitive     PIP/TAZO <=4 SENSITIVE Sensitive     * ESCHERICHIA COLI  MRSA PCR Screening     Status: None   Collection Time: 04/18/14  2:55 AM  Result Value Ref Range Status   MRSA by PCR NEGATIVE NEGATIVE Final    Comment:        The GeneXpert MRSA Assay (FDA approved for NASAL specimens only), is one component of a comprehensive MRSA colonization surveillance program. It is not intended to  diagnose MRSA infection nor to guide or monitor  treatment for MRSA infections.   Clostridium Difficile by PCR     Status: None   Collection Time: 04/20/14  5:35 AM  Result Value Ref Range Status   C difficile by pcr NEGATIVE NEGATIVE Final     BRIEF HOSPITAL COURSE:  Brief Narrative:  80 year old female with PMH DM, and PAF. Was admitted to [facility] for septic shock For perforated cholecystitis with surgical repair 01/29/2014. She was an mechanical ventilator for the entirety of her stay, which was complicated by AF RVR, C-diff, and refractory respiratory failure which ultimately resulted in trach/PEG 2/5. Also had upper extremity DVT from line, however unable to tolerate anticoagulation due to hemoptysis. She was admitted to [facility] 2/23. Since her admission there she has shown minimal functional improvement, however it sounds as though she has progressed to tolerate some capping trials with trach. While at the SNF wing of [facility], patient was noted to have more depressed level of responsiveness than her usual baseline, lab work showed profound hyponatremia, hyperkalemia and hyperglycemia. She was then sent to the emergency room for further evaluation, and subsequently required transfer to the intensive care unit for further management. Upon stabilization, she was transferred to [facility] on 04/22/14  Hospital course by problem list: Hypernatremia: Suspect likely secondary to dehydration. Admitted and started on isotonic saline, once volume status better, patient was then started on hypotonic saline. Nephrology was also consulted. Sodium has slowly decreased, currently down 152. On admission sodium was significantly elevated at 174. We will continue with D5 W, but would increase free water via the PEG tube. Recheck electrolytes daily while at [facility] till Na normalizes   Acute renal failure: Likely prerenal azotemia from dehydration. Creatinine on admission was at 3.22, currently creatinine has  normalized with IV fluids. Continue to monitor electrolytes periodically  Hyperosmolar hyperglycemic nonketotic state: Required insulin infusion on admission, CBGs now stable with just SSI. A1c only at 6.5. Suspect should be initiated on oral hypoglycemic agents once acute issues are over.   Hyperkalemia: Seen on admission, likely secondary to acute renal failure. Has resolved with supportive care.   Severe sepsis: Secondary to UTI and pneumonia. Sepsis pathophysiology has resolved. Blood culture negative (one set did show coagulase-negative staph-likely a contamination), urine culture positive Escherichia coli sensitive to ceftriaxone.   UTI: Continue Rocephin, has been on antibiotics since 3/31 (previously on vancomycin and Zosyn). Will need complete a 7-10 course of antibiotics-as of 4/5-on day 6 of antimicrobial therapy   Healthcare associated pneumonia: On admission was started on vancomycin and Zosyn, all cultures continue to be negative, antibiotics has not been narrowed down to just Rocephin. As noted above, Will need complete a 7-10 course of antibiotics.As of 4/5-on day 6 of antimicrobial therapy   Acute on chronic hypoxemic respiratory failure: Status post trach, back on trach collar. Acute respiratory failure likely secondary to acute illness and pneumonia. Please continue trach care while at [facility]   Hypertension: Will resume low-dose metoprolol and follow BP.    Atrial fibrillation with rapid ventricular response: Occurred while in the intensive care unit. Currently in sinus rhythm. Resuming metoprolol. Monitor in telemetry.CHADS2VASC of 6-suspect very poor candidate for anticoagulation given on the above issues noted. Also, did not tolerate anticoagulation because of several episodes of hemoptysis and bloody secretions recently (please see discharge summary from [facility])   Anemia: Secondary to critical illness and chronic disease. Hemoglobin stable at 7.4. No overt blood  loss evident, plans were to transfuse if less than 7.  Thrombocytopenia: Platelet count rebounding. Suspect secondary to sepsis. Continue to check CBCs to ensure normalization of platelet count, avoid Lovenox till plated count completely normalizes.   6 mm nodule in the right lung apex: Repeat CT chest in 6-12 months   History of cholecystitis with a perforated gallbladder and septic shock with prolonged chemical ventilation-status post tracheostomy and PEG tube placement. Discharge from [facility] on 03/11/14  Note this M.D. discussed case with family-Mrs. Hinson-sister over the phone, she would like patient to be transferred to [facility]. Thankfully a bed is available, patient is stable for transfer.   TODAY-DAY OF DISCHARGE:  Subjective:   Lanitra Raynes today remains stable.  Objective:   Blood pressure 148/52, pulse 90, temperature 98.5 F (36.9 C), temperature source Oral, resp. rate 18, height 5\' 3"  (1.6 m), weight 80 kg (176 lb 5.9 oz), SpO2 100 %.  Intake/Output Summary (Last 24 hours) at 04/22/14 1532 Last data filed at 04/22/14 1131  Gross per 24 hour  Intake 1987.08 ml  Output   2800 ml  Net -812.92 ml   Filed Weights   04/21/14 0500 04/21/14 0555 04/22/14 0531  Weight: 79 kg (174 lb 2.6 oz) 78.8 kg (173 lb 11.6 oz) 80 kg (176 lb 5.9 oz)    Exam Gen Exam: Awake-tracks movement. But nonverbal Neck: Supple, No JVD. Trach collar in place Chest: B/L Clear.  CVS: S1 S2 Regular, no murmurs.  Abdomen: soft, BS +, non tender, non distended. RUQ incision-slightly open but mostly dry. Extremities: no edema, lower extremities warm to touch.  DISCHARGE CONDITION: Stable  DISPOSITION: LTACH  DISCHARGE INSTRUCTIONS:    Activity:  As tolerated with Full fall precautions use walker/cane & assistance as needed  Diet recommendation: NPO-on peg feeds  Discharge Instructions    Increase activity slowly    Complete by:  As directed             Follow-up Information    Follow up with Hillary Bow, MD. Schedule an appointment as soon as possible for a visit in 2 days.   Specialty:  Internal Medicine   Contact information:   [address] Lure[place] Rued [place] [address] [phone]         Total Time spent on discharge equals 45 minutes.  SignedJeoffrey Massed 04/22/2014 3:32 PM

## 2014-04-22 NOTE — Progress Notes (Signed)
CARE MANAGEMENT NOTE 04/22/2014  Patient:  Sierra Kerr,Sierra Kerr   Account Number:  [id number]  Date Initiated:  04/18/2014  Documentation initiated by:  [facility]OWN,SARAH  Subjective/Objective Assessment:   Admitted from SNF at [facility] with Sepsis.     Action/Plan:   Anticipated DC Date:  04/21/2014   Anticipated DC Plan:  LONG TERM ACUTE CARE (LTAC)  In-house referral  Clinical Social Worker      DC Planning Services  CM consult      Choice offered to / List presented to:             Status of service:  Completed, signed off Medicare Important Message given?  YES (If response is "NO", the following Medicare IM given date fields will be blank) Date Medicare IM given:  04/22/2014 Medicare IM given by:  [facility]Je[facility] R[facility] Date Additional Medicare IM given:   Additional Medicare IM given by:    Discharge Disposition:  LONG TERM ACUTE CARE (LTAC)  Per UR Regulation:  Reviewed for med. necessity/level of care/duration of stay  If discussed at Long Length of Stay Meetings, dates discussed:    Comments:  04/21/2014 1455 Attending spoke to sister. Contacted [facility] rep and they can accept pt today to LTAC. Contacted sister via, Geroge BasemanMary Kerr [phone] and verbal consent received with NCM and unit charge RN, Derryl HarborLonnie. Ms. Benjiman CoreHinton is agreement to tranfer to LTAC. She is requesting [facility] to contact her when she arrives at facility.   Isidoro DonningAlesia Kahleah Crass RN CCM Case Mgmt phone [phone]  04-18-14 12:05pm Avie ArenasSarah Brown, RNBSN - [phone] Has Ltach days if needs higher level.  Per physician feels will be able to go back to SNF.  SW referral made.  Per SW [facility] can take patient back and can do over w/e if needed.

## 2014-04-24 LAB — CULTURE, BLOOD (ROUTINE X 2): Culture: NO GROWTH

## 2016-01-29 IMAGING — US US RENAL
1 series · 14 of 19 positions shown · non-contrast
Comparison: Noncontrast CT 4 hr prior.

CLINICAL DATA: Acute renal failure.

EXAM:
RENAL/URINARY TRACT ULTRASOUND COMPLETE

[Series 1: us renal · 0.23mm/px · 14 of 19 slices shown]
[im 1/19]
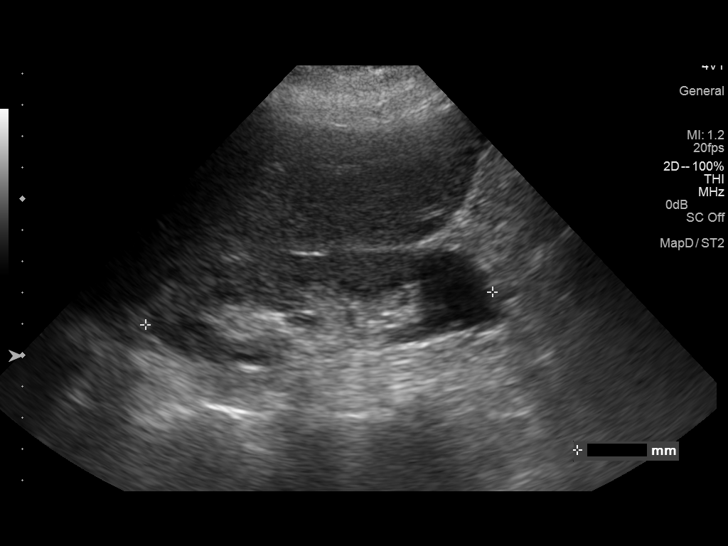
[im 3/19]
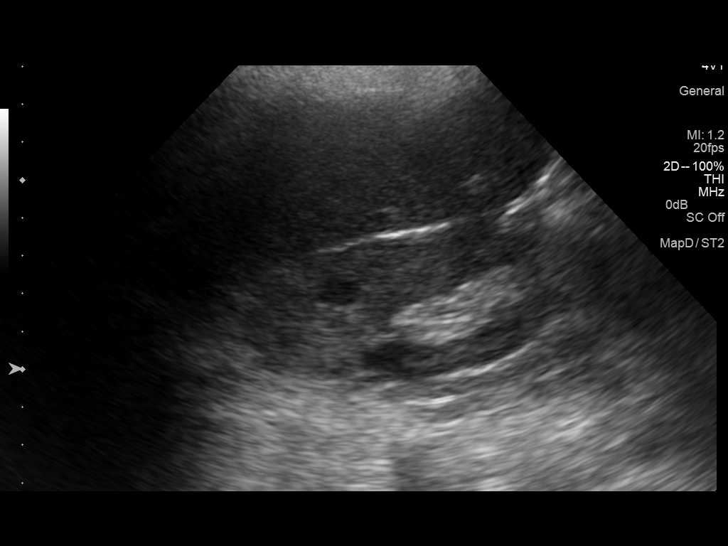
[im 4/19]
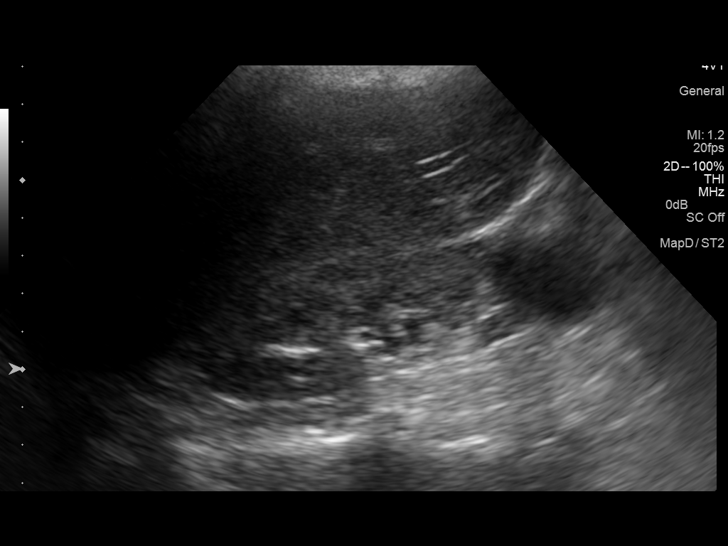
[im 5/19]
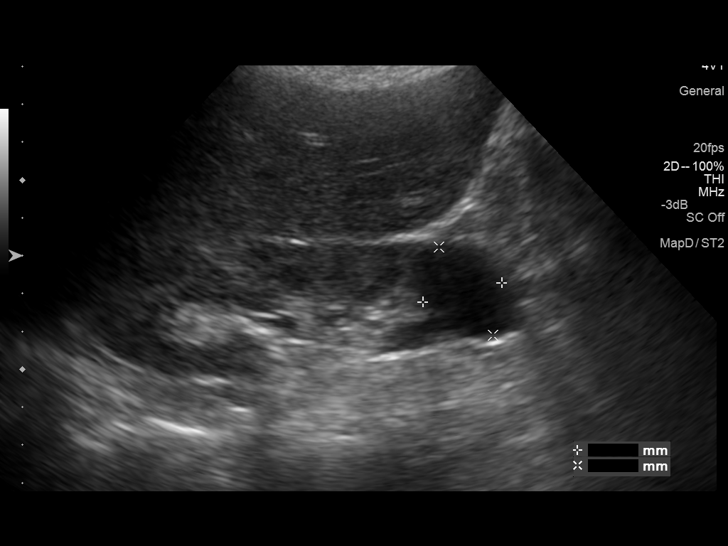
[im 7/19]
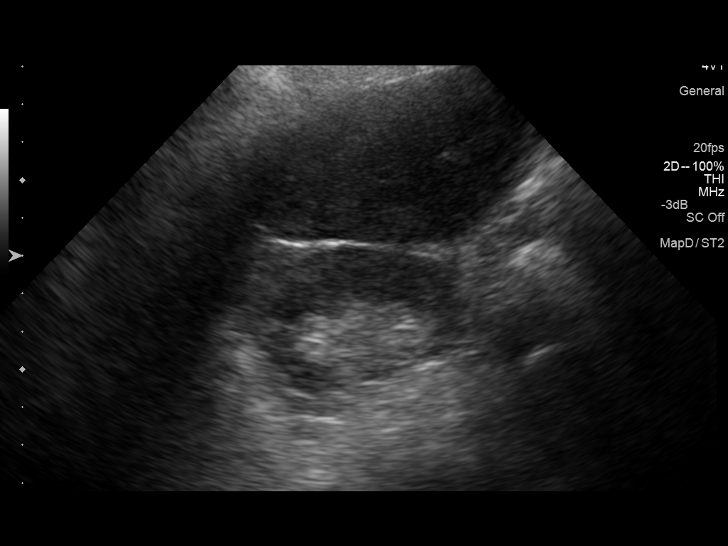
[im 8/19]
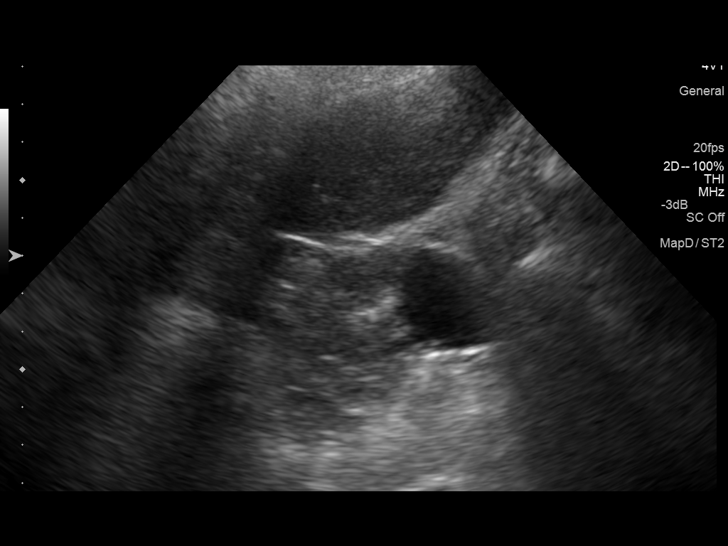
[im 9/19]
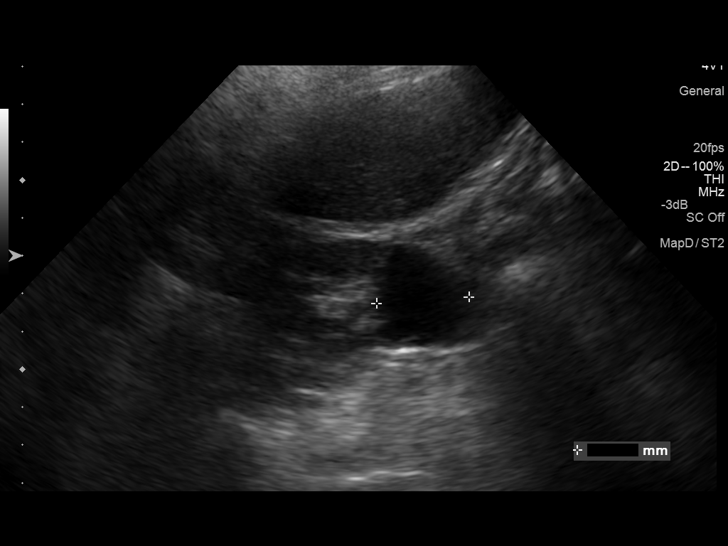
[im 11/19]
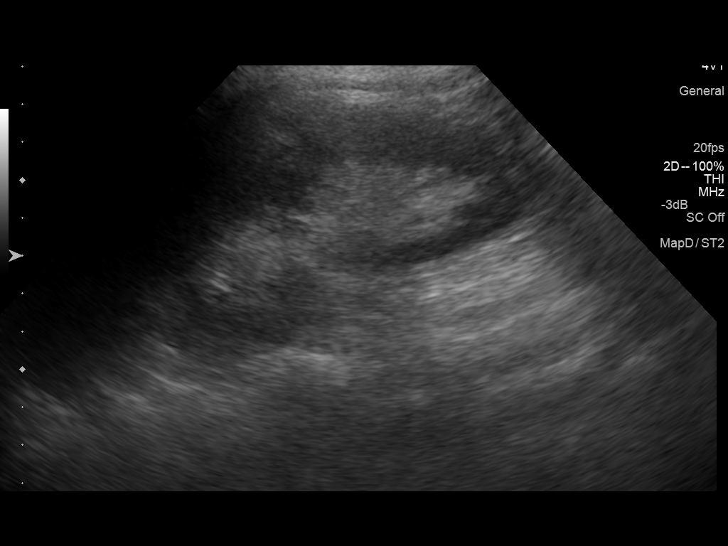
[im 12/19]
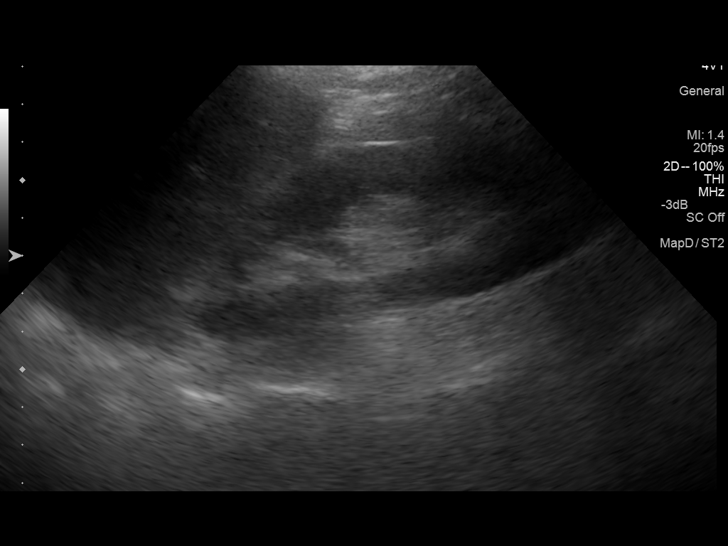
[im 13/19]
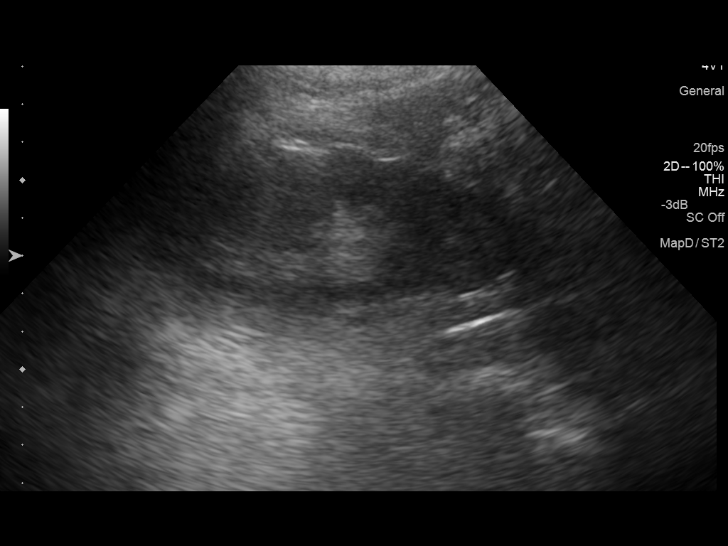
[im 15/19]
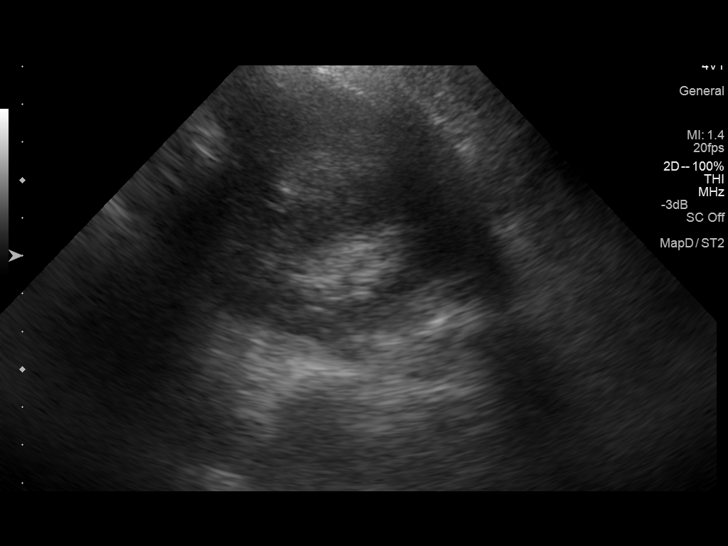
[im 16/19]
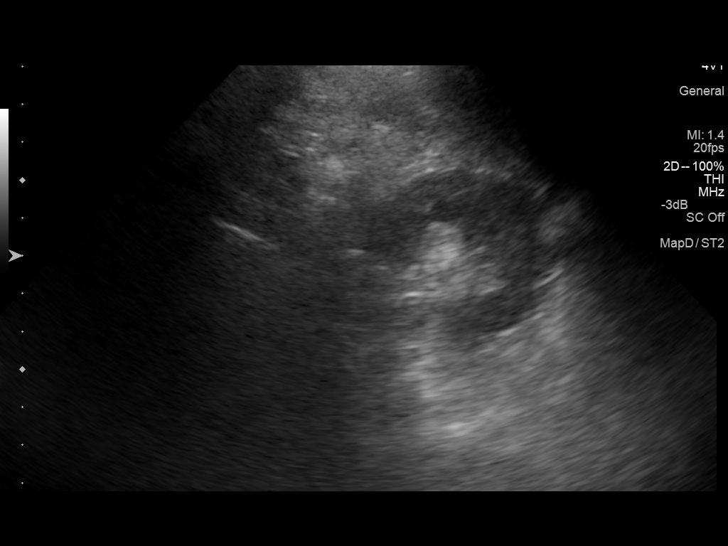
[im 17/19]
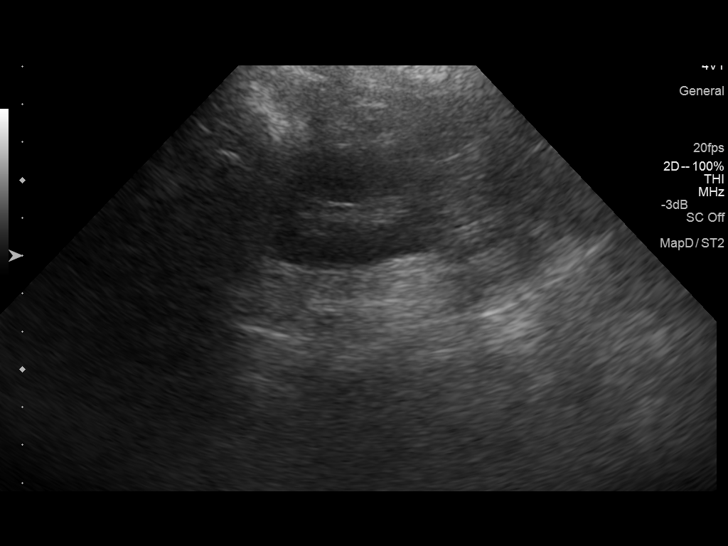
[im 19/19]
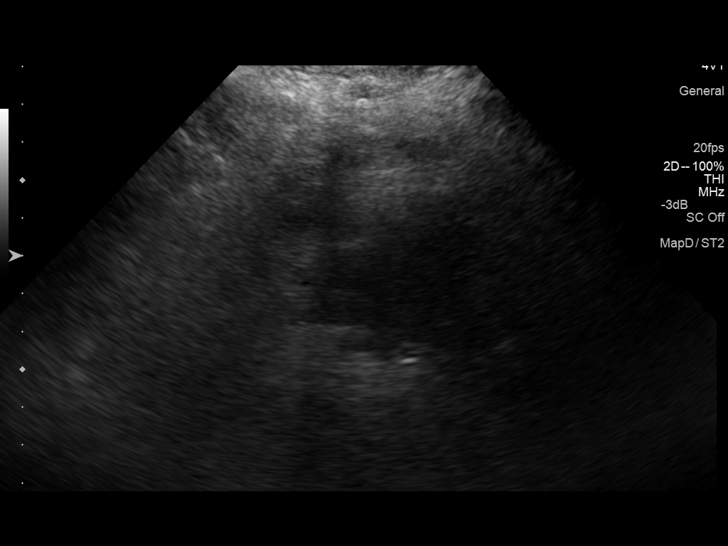

[14 of 19 positions shown; findings below may reference images not displayed]

FINDINGS: Right Kidney:

Length: 11.1 cm. There is thinning of the renal parenchyma and
increased renal echogenicity. No hydronephrosis. Small hypoechoic
structures consistent with cysts, largest in the lower pole
measuring 2.2 x 2.7 x 2.5 cm corresponding to questioned abnormality
on CT.

Left Kidney:

Length: 11.2 cm. Increased renal echogenicity and thinning of renal
parenchyma. No mass or hydronephrosis visualized.

Bladder:

Decompressed by Foley catheter and not assessed.
IMPRESSION: 1. Echogenic kidneys with thinning of the renal parenchyma
consistent with chronic medical renal disease. No obstructive
uropathy.
2. Right renal cysts, largest in the lower pole measuring 2.7 cm.
This corresponds to the indeterminate abnormality on CT.

## 2018-01-19 ENCOUNTER — Telehealth: Payer: Self-pay

## 2018-03-13 NOTE — Telephone Encounter (Signed)
ERROR
